# Patient Record
Sex: Female | Born: 1960 | Race: White | Hispanic: No | Marital: Married | Smoking: Current every day smoker
Health system: Southern US, Community
[De-identification: ages and names within clinical notes are randomized; demographics above are authoritative.]

## PROBLEM LIST (undated history)

## (undated) DIAGNOSIS — T8859XA Other complications of anesthesia, initial encounter: Secondary | ICD-10-CM

## (undated) DIAGNOSIS — R0600 Dyspnea, unspecified: Secondary | ICD-10-CM

## (undated) DIAGNOSIS — R112 Nausea with vomiting, unspecified: Secondary | ICD-10-CM

## (undated) DIAGNOSIS — J45909 Unspecified asthma, uncomplicated: Secondary | ICD-10-CM

## (undated) DIAGNOSIS — Z8489 Family history of other specified conditions: Secondary | ICD-10-CM

## (undated) DIAGNOSIS — D649 Anemia, unspecified: Secondary | ICD-10-CM

## (undated) DIAGNOSIS — R519 Headache, unspecified: Secondary | ICD-10-CM

## (undated) DIAGNOSIS — K219 Gastro-esophageal reflux disease without esophagitis: Secondary | ICD-10-CM

## (undated) DIAGNOSIS — J449 Chronic obstructive pulmonary disease, unspecified: Secondary | ICD-10-CM

## (undated) DIAGNOSIS — T4145XA Adverse effect of unspecified anesthetic, initial encounter: Secondary | ICD-10-CM

## (undated) DIAGNOSIS — R51 Headache: Secondary | ICD-10-CM

## (undated) DIAGNOSIS — Z9889 Other specified postprocedural states: Secondary | ICD-10-CM

## (undated) HISTORY — PX: CHOLECYSTECTOMY: SHX55

## (undated) HISTORY — DX: Chronic obstructive pulmonary disease, unspecified: J44.9

## (undated) HISTORY — DX: Unspecified asthma, uncomplicated: J45.909

## (undated) HISTORY — PX: ABDOMINAL HYSTERECTOMY: SHX81

## (undated) HISTORY — PX: FRACTURE SURGERY: SHX138

---

## 2016-09-13 HISTORY — PX: COLONOSCOPY: SHX174

## 2017-03-07 ENCOUNTER — Ambulatory Visit
Admission: RE | Admit: 2017-03-07 | Discharge: 2017-03-07 | Disposition: A | Discharge status: Home / Self Care | Payer: Self-pay | Source: Ambulatory Visit | Attending: Oncology | Admitting: Oncology

## 2017-03-07 ENCOUNTER — Ambulatory Visit: Payer: Self-pay | Attending: Oncology

## 2017-03-07 ENCOUNTER — Encounter (INDEPENDENT_AMBULATORY_CARE_PROVIDER_SITE_OTHER): Payer: Self-pay

## 2017-03-07 VITALS — Ht 62.0 in | Wt 182.0 lb

## 2017-03-07 DIAGNOSIS — Z0000 Encounter for general adult medical examination without abnormal findings: Secondary | ICD-10-CM

## 2017-03-07 NOTE — Progress Notes (Signed)
Subjective:     Patient ID: Raven Rogers, female   DOB: November 08, 1960, 56 y.o.   MRN: [id number]  HPI   Review of Systems     Objective:   Physical Exam  Pulmonary/Chest: Right breast exhibits no inverted nipple, no mass, no nipple discharge, no skin change and no tenderness. Left breast exhibits no inverted nipple, no mass, no nipple discharge, no skin change and no tenderness. Breasts are asymmetrical.  Right breast smaller than left       Assessment:     56 year old patient presents for [place] clinic visit. Patient screened, and meets [facility] eligibility.  Patient does not have insurance, Medicare or Medicaid.  Handout given on Affordable Care Act. Instructed patient on breast self-exam using teach back method. CBE unremarkable.  No mass or lump palpated. Last mammogram in 2016 at [facility].    Plan:     Sent for bilateral screening mammogram.

## 2017-03-08 ENCOUNTER — Other Ambulatory Visit: Payer: Self-pay | Admitting: *Deleted

## 2017-03-08 ENCOUNTER — Inpatient Hospital Stay
Admission: RE | Admit: 2017-03-08 | Discharge: 2017-03-08 | Disposition: A | Discharge status: Home / Self Care | Payer: Self-pay | Source: Ambulatory Visit | Attending: *Deleted | Admitting: *Deleted

## 2017-03-08 DIAGNOSIS — Z9289 Personal history of other medical treatment: Secondary | ICD-10-CM

## 2017-03-09 ENCOUNTER — Other Ambulatory Visit: Payer: Self-pay | Admitting: *Deleted

## 2017-03-09 DIAGNOSIS — N6489 Other specified disorders of breast: Secondary | ICD-10-CM

## 2017-03-17 ENCOUNTER — Ambulatory Visit
Admission: RE | Admit: 2017-03-17 | Discharge: 2017-03-17 | Disposition: A | Discharge status: Home / Self Care | Payer: Self-pay | Source: Ambulatory Visit | Attending: Oncology | Admitting: Oncology

## 2017-03-17 ENCOUNTER — Other Ambulatory Visit: Payer: Self-pay

## 2017-03-17 DIAGNOSIS — N6489 Other specified disorders of breast: Secondary | ICD-10-CM

## 2017-03-17 DIAGNOSIS — N63 Unspecified lump in unspecified breast: Secondary | ICD-10-CM

## 2017-03-23 ENCOUNTER — Ambulatory Visit
Admission: RE | Admit: 2017-03-23 | Discharge: 2017-03-23 | Disposition: A | Discharge status: Home / Self Care | Payer: Self-pay | Source: Ambulatory Visit | Attending: Oncology | Admitting: Oncology

## 2017-03-23 DIAGNOSIS — N63 Unspecified lump in unspecified breast: Secondary | ICD-10-CM

## 2017-03-23 HISTORY — PX: BREAST BIOPSY: SHX20

## 2017-03-24 LAB — SURGICAL PATHOLOGY

## 2017-03-31 ENCOUNTER — Encounter: Payer: Self-pay | Admitting: *Deleted

## 2017-03-31 NOTE — Progress Notes (Addendum)
Radiologist recommends surgical consult for left breast complex sclerosing lesion.  Scheduled consult with Dr. Jamal Collin on 04/12/17 at 10:15.  Notified patient ,and mailed appointment information. Explained to office, and to patient that [place] does not cover excision of benign lesion, and she may need to apply for [place] assistance.

## 2017-04-12 ENCOUNTER — Ambulatory Visit (INDEPENDENT_AMBULATORY_CARE_PROVIDER_SITE_OTHER): Payer: PRIVATE HEALTH INSURANCE | Admitting: General Surgery

## 2017-04-12 ENCOUNTER — Encounter: Payer: Self-pay | Admitting: General Surgery

## 2017-04-12 VITALS — BP 100/70 | HR 100 | Resp 14 | Ht 62.0 in | Wt 181.0 lb

## 2017-04-12 DIAGNOSIS — D242 Benign neoplasm of left breast: Secondary | ICD-10-CM | POA: Diagnosis not present

## 2017-04-12 NOTE — Patient Instructions (Addendum)
Follow-up diagnostic mammogram in 5 months

## 2017-04-12 NOTE — Progress Notes (Signed)
Patient ID: Raven Rogers, female   DOB: 1961/06/23, 56 y.o.   MRN: [id number]  Chief Complaint  Patient presents with  . Breast Problem    HPI Raven Rogers is a 56 y.o. female.  who presents for a breast evaluation referred by Koren Shiver RN with [facility]. The most recent mammogram and left breast biopsy was done on 03-23-17 showing complex sclerosing lesion. She could not feel anything different in the breast. Patient does perform regular self breast checks and gets regular mammograms done.   She is her with her husband.  HPI  Past Medical History:  Diagnosis Date  . Asthma   . COPD (chronic obstructive pulmonary disease) ([place])     Past Surgical History:  Procedure Laterality Date  . ABDOMINAL HYSTERECTOMY    . BREAST BIOPSY Left 03/23/2017   FEATURES CONSISTENT WITH A COMPLEX SCLEROSING LESION.   Marland Kitchen COLONOSCOPY  2018   [facility]  . FRACTURE SURGERY     leg    Family History  Problem Relation Age of Onset  . Colon cancer Mother 9  . Colon cancer Sister 39  . Breast cancer Neg Hx     Social History Social History  Substance Use Topics  . Smoking status: Current Every Day Smoker    Packs/day: 1.00    Years: 40.00    Types: Cigarettes  . Smokeless tobacco: Never Used  . Alcohol use No    No Known Allergies  Current Outpatient Prescriptions  Medication Sig Dispense Refill  . albuterol (PROVENTIL HFA;VENTOLIN HFA) 108 (90 Base) MCG/ACT inhaler Inhale into the lungs.    . budesonide-formoterol (SYMBICORT) 160-4.5 MCG/ACT inhaler Inhale 2 puffs into the lungs 2 (two) times daily.    . cetirizine (ZYRTEC) 10 MG tablet Take 10 mg by mouth.    Marland Kitchen ipratropium-albuterol (DUONEB) 0.5-2.5 (3) MG/3ML SOLN Take 3 mLs by nebulization.    . montelukast (SINGULAIR) 10 MG tablet Take 10 mg by mouth.    . ranitidine (ZANTAC) 150 MG tablet ranitidine 150 mg tablet  Take 1 tablet twice a day by oral route before meals.    . tiotropium (SPIRIVA) 18 MCG inhalation capsule Place into  inhaler and inhale.    . traZODone (DESYREL) 100 MG tablet Take 50 mg by mouth.     No current facility-administered medications for this visit.     Review of Systems Review of Systems  Constitutional: Negative.   Respiratory: Negative.   Cardiovascular: Negative.     Blood pressure 100/70, pulse 100, resp. rate 14, height 5\' 2"  (1.575 m), weight 181 lb (82.1 kg).  Physical Exam Physical Exam  Constitutional: She is oriented to person, place, and time. She appears well-developed and well-nourished.  Pulmonary/Chest: Right breast exhibits no inverted nipple, no mass, no nipple discharge, no skin change and no tenderness. Left breast exhibits no inverted nipple, no mass, no nipple discharge, no skin change and no tenderness. Breasts are symmetrical.  Lymphadenopathy:    She has no cervical adenopathy.    She has no axillary adenopathy.  Neurological: She is alert and oriented to person, place, and time.  Skin: Skin is warm and dry.  Psychiatric: She has a normal mood and affect.    Data Reviewed [place], mammogram,pathology Recent biopsy shows complex sclerosing lesion of upper inner quadrant of left breast, no atypia   Assessment   Complex sclerosing lesion - recommend careful monitoring vs excision at this time. No atypia was noted on pathology and likelihood of path upgrade is very  low.  Pt advised on the above.  She is agreeable with the plan     Plan    Follow-up diagnostic mammogram in 5 months    HPI, Physical Exam, Assessment and Plan have been scribed under the direction and in the presence of Mckinley Jewel, MD Karie Fetch, RN  I have completed the exam and reviewed the above documentation for accuracy and completeness.  I agree with the above.  Haematologist has been used and any errors in dictation or transcription are unintentional.  Raven Rogers G. Jamal Collin, M.D., F.A.C.S.   Junie Panning G 04/12/2017, 11:55 AM

## 2017-05-10 ENCOUNTER — Other Ambulatory Visit: Payer: Self-pay

## 2017-05-10 DIAGNOSIS — N63 Unspecified lump in unspecified breast: Secondary | ICD-10-CM

## 2017-05-10 NOTE — Progress Notes (Signed)
Per Dr. Angie Fava request, patient is scheduled for short-term follow-up mammogram, and ultrasound of left breast on 09/09/17 at 1:20 p.m.  Mailed appointment information to patient.

## 2017-08-31 ENCOUNTER — Encounter: Payer: Self-pay | Admitting: General Surgery

## 2017-08-31 ENCOUNTER — Ambulatory Visit (INDEPENDENT_AMBULATORY_CARE_PROVIDER_SITE_OTHER): Payer: Self-pay | Admitting: General Surgery

## 2017-08-31 VITALS — BP 130/74 | HR 101 | Resp 14 | Ht 62.5 in | Wt 184.0 lb

## 2017-08-31 DIAGNOSIS — D242 Benign neoplasm of left breast: Secondary | ICD-10-CM

## 2017-08-31 NOTE — Progress Notes (Signed)
Patient ID: Raven Rogers, female   DOB: April 09, 1961, 56 y.o.   MRN: [id number]  Chief Complaint  Patient presents with  . Follow-up    mammogram    HPI Raven Rogers is a 56 y.o. female who presents for a breast evaluation. Her mammogram is scheduled for 09/09/17. Patient does perform regular self breast checks and gets regular mammograms done.   She reports the left breast is still a little tender.    HPI  Past Medical History:  Diagnosis Date  . Asthma   . COPD (chronic obstructive pulmonary disease) ([place])     Past Surgical History:  Procedure Laterality Date  . ABDOMINAL HYSTERECTOMY    . BREAST BIOPSY Left 03/23/2017   FEATURES CONSISTENT WITH A COMPLEX SCLEROSING LESION.   Marland Kitchen COLONOSCOPY  2018   [facility]  . FRACTURE SURGERY     leg    Family History  Problem Relation Age of Onset  . Colon cancer Mother 8  . Colon cancer Sister 30  . Breast cancer Neg Hx     Social History Social History   Tobacco Use  . Smoking status: Current Every Day Smoker    Packs/day: 1.00    Years: 40.00    Pack years: 40.00    Types: Cigarettes  . Smokeless tobacco: Never Used  Substance Use Topics  . Alcohol use: No  . Drug use: No    No Known Allergies  Current Outpatient Medications  Medication Sig Dispense Refill  . albuterol (PROVENTIL HFA;VENTOLIN HFA) 108 (90 Base) MCG/ACT inhaler Inhale into the lungs.    . budesonide-formoterol (SYMBICORT) 160-4.5 MCG/ACT inhaler Inhale 2 puffs into the lungs 2 (two) times daily.    . cetirizine (ZYRTEC) 10 MG tablet Take 10 mg by mouth.    Marland Kitchen ipratropium-albuterol (DUONEB) 0.5-2.5 (3) MG/3ML SOLN Take 3 mLs by nebulization.    . montelukast (SINGULAIR) 10 MG tablet Take 10 mg by mouth.    . ranitidine (ZANTAC) 150 MG tablet ranitidine 150 mg tablet  Take 1 tablet twice a day by oral route before meals.    . tiotropium (SPIRIVA) 18 MCG inhalation capsule Place into inhaler and inhale.    . traZODone (DESYREL) 100 MG tablet Take  50 mg by mouth.     No current facility-administered medications for this visit.     Review of Systems Review of Systems  Constitutional: Negative.   Respiratory: Negative.   Cardiovascular: Negative.     Blood pressure 130/74, pulse (!) 101, resp. rate 14, height 5' 2.5" (1.588 m), weight 184 lb (83.5 kg).  Physical Exam Physical Exam  Constitutional: She is oriented to person, place, and time. She appears well-developed and well-nourished.  Eyes: Conjunctivae are normal. No scleral icterus.  Neck: Neck supple.  Cardiovascular: Normal rate, regular rhythm and normal heart sounds.  Pulmonary/Chest: Effort normal and breath sounds normal. Right breast exhibits no inverted nipple, no mass, no nipple discharge, no skin change and no tenderness. Left breast exhibits no inverted nipple, no mass, no nipple discharge, no skin change and no tenderness.  Abdominal: Soft. Normal appearance. There is no hepatosplenomegaly.  Lymphadenopathy:    She has no cervical adenopathy.    She has no axillary adenopathy.  Neurological: She is alert and oriented to person, place, and time.  Skin: Skin is warm and dry.  Psychiatric: She has a normal mood and affect.    Data Reviewed Previous notes and pathology  Assessment    Exam is stable.  Patient had  biopsy of a small lesion in the left breast which is a complex sclerosing lesion with no atypia.  Patient was given options of excision versus close follow-up.  She opted for the latter.  Since her mammogram is not scheduled until later this month I have asked that she follow-up with Koren Shiver from [place].    Plan    Mammogram on 09/09/17. Will follow up with Koren Shiver. To return to see Dr Bary Castilla if needed.     HPI, Physical Exam, Assessment and Plan have been scribed under the direction and in the presence of Mckinley Jewel, MD  Concepcion Living, LPN  I have completed the exam and reviewed the above documentation for accuracy and completeness.   I agree with the above.  Haematologist has been used and any errors in dictation or transcription are unintentional.  Garmon Dehn G. Jamal Collin, M.D., F.A.C.S.   Junie Panning G 09/01/2017, 8:37 AM

## 2017-08-31 NOTE — Patient Instructions (Signed)
Mammogram on 09/09/17. Will follow up with Koren Shiver. To return to see Dr Bary Castilla if needed.  Continue self breast exams. Call office for any new breast issues or concerns.

## 2017-09-09 ENCOUNTER — Ambulatory Visit
Admission: RE | Admit: 2017-09-09 | Discharge: 2017-09-09 | Disposition: A | Discharge status: Home / Self Care | Payer: Self-pay | Source: Ambulatory Visit | Attending: Oncology | Admitting: Oncology

## 2017-09-09 DIAGNOSIS — N63 Unspecified lump in unspecified breast: Secondary | ICD-10-CM

## 2017-09-12 ENCOUNTER — Encounter: Payer: Self-pay | Admitting: *Deleted

## 2017-09-12 NOTE — Progress Notes (Signed)
Patient has a birads 4 mammogram with recommendation for excision.  Talked with patient this morning and I have scheduled her to see Dr. Bary Castilla on 09/20/17 @ 10:45.  She is to arrive at 10:15, take a photo ID, all her meds, and remind them she is in [place].  Will follow-up per [facility] protocol.

## 2017-09-20 ENCOUNTER — Ambulatory Visit (INDEPENDENT_AMBULATORY_CARE_PROVIDER_SITE_OTHER): Payer: Self-pay | Admitting: General Surgery

## 2017-09-20 ENCOUNTER — Encounter: Payer: Self-pay | Admitting: General Surgery

## 2017-09-20 VITALS — BP 132/70 | HR 101 | Resp 14 | Ht 64.0 in | Wt 183.0 lb

## 2017-09-20 DIAGNOSIS — N6489 Other specified disorders of breast: Secondary | ICD-10-CM | POA: Insufficient documentation

## 2017-09-20 NOTE — Progress Notes (Addendum)
Patient ID: Raven Rogers, female   DOB: 05/20/1961, 57 y.o.   MRN: [id number]  Chief Complaint  Patient presents with  . Other    HPI Raven Rogers is a 57 y.o. female who presents for a breast evaluation. The most recent mammogram was done on 09/09/2017 and ultrasound. .  Patient does perform regular self breast checks and gets regular mammograms done. Tender under her left breast. Patient states six months ago, she had a biopsy done 03/23/2017.  HPI  Past Medical History:  Diagnosis Date  . Asthma   . COPD (chronic obstructive pulmonary disease) ([place])     Past Surgical History:  Procedure Laterality Date  . ABDOMINAL HYSTERECTOMY    . BREAST BIOPSY Left 03/23/2017   FEATURES CONSISTENT WITH A COMPLEX SCLEROSING LESION.   Marland Kitchen COLONOSCOPY  2018   [facility]  . FRACTURE SURGERY     leg    Family History  Problem Relation Age of Onset  . Colon cancer Mother 65  . Colon cancer Sister 35  . Breast cancer Neg Hx     Social History Social History   Tobacco Use  . Smoking status: Current Every Day Smoker    Packs/day: 1.00    Years: 40.00    Pack years: 40.00    Types: Cigarettes  . Smokeless tobacco: Never Used  Substance Use Topics  . Alcohol use: No  . Drug use: No    No Known Allergies  Current Outpatient Medications  Medication Sig Dispense Refill  . albuterol (PROVENTIL HFA;VENTOLIN HFA) 108 (90 Base) MCG/ACT inhaler Inhale into the lungs.    . budesonide-formoterol (SYMBICORT) 160-4.5 MCG/ACT inhaler Inhale 2 puffs into the lungs 2 (two) times daily.    . cetirizine (ZYRTEC) 10 MG tablet Take 10 mg by mouth.    Marland Kitchen ipratropium-albuterol (DUONEB) 0.5-2.5 (3) MG/3ML SOLN Take 3 mLs by nebulization.    . montelukast (SINGULAIR) 10 MG tablet Take 10 mg by mouth.    . tiotropium (SPIRIVA) 18 MCG inhalation capsule Place into inhaler and inhale.    . traZODone (DESYREL) 100 MG tablet Take 50 mg by mouth.     No current facility-administered medications for this  visit.     Review of Systems Review of Systems  Constitutional: Negative.   Respiratory: Positive for shortness of breath (COPD).   Cardiovascular: Negative.     Blood pressure 132/70, pulse (!) 101, resp. rate 14, height 5\' 4"  (1.626 m), weight 183 lb (83 kg).  Physical Exam Physical Exam  Constitutional: She is oriented to person, place, and time. She appears well-developed and well-nourished.  Eyes: Conjunctivae are normal. No scleral icterus.  Neck: Neck supple.  Cardiovascular: Normal rate, regular rhythm and normal heart sounds.  Pulmonary/Chest: Effort normal and breath sounds normal. Right breast exhibits no inverted nipple, no mass, no nipple discharge, no skin change and no tenderness. Left breast exhibits tenderness (left outer quadrant.). Left breast exhibits no inverted nipple, no mass, no nipple discharge and no skin change.  Lymphadenopathy:    She has no cervical adenopathy.    She has no axillary adenopathy.  Neurological: She is alert and oriented to person, place, and time.  Skin: Skin is warm and dry.    Data Reviewed March 23, 2017 biopsy: DIAGNOSIS:  A. LEFT BREAST, UPPER INNER QUADRANT; STEREOTACTIC BIOPSY:  - FEATURES CONSISTENT WITH A COMPLEX SCLEROSING LESION.   Note: Atypia is not seen in this material. The changes span 2.1 cm.   Review of the March 17, 2017 mammograms through September 09, 2017 mammograms including a ultrasound from the latter date were reviewed.  Maximum diameter on mammography was 1.2 cm, maximum diameter on ultrasound was 4 mm.  It is unusual to have the pathology show a larger lesion for radial scars.   The mammogram shows a residual distortion in the area of the top at marker.  Ultrasound shows a small residual biopsy cavity.  BI-RADS-4 based on original pathology.   Assessment      Benign breast exam.  Previous biopsy showing radial scar.    Plan    I spoke with the pathologist to confirm if the 2.1 cm size was correct.   If so I would recommend formal excision.  If this is a decibel air and it is really a point to 1 cm lesion, and in light of her recent imaging showing minimal residual changes from the biopsy, I would support either observation or excision based on the patient's desire.  This information was relayed to the patient and will make a final decision based on repeat pathologic review.    Follow up appointment to be announced. Patient to have a excision or have a mammogram in six months.   HPI, Physical Exam, Assessment and Plan have been scribed under the direction and in the presence of Hervey Ard, MD.  Gaspar Cola, CMA  I have completed the exam and reviewed the above documentation for accuracy and completeness.  I agree with the above.  Haematologist has been used and any errors in dictation or transcription are unintentional.  Hervey Ard, M.D., F.A.C.S. Robert Bellow 09/20/2017, 11:13 AM  Messaging received from the pathologist who was asked to review the original sample showed that area of complex sclerosing lesion/radial scar is likely 8 mm in diameter.  This is certainly acceptable for ongoing follow-up as no atypia has been appreciated.  This information will be forwarded to the patient for her decision making process.  Options remain observation versus excision.

## 2017-09-20 NOTE — Telephone Encounter (Signed)
Patient called back and wanted to proceed with surgery

## 2017-09-20 NOTE — Patient Instructions (Signed)
Follow up appointment to be announced. Patient to have a excision or have a mammogram in six months.

## 2017-09-21 ENCOUNTER — Telehealth: Payer: Self-pay | Admitting: *Deleted

## 2017-09-21 NOTE — Telephone Encounter (Signed)
She would like to have the area excised. This will be arraged.

## 2017-09-21 NOTE — Telephone Encounter (Signed)
Patient's surgery has been scheduled for 09-30-17 at [facility].   Surgery instructions have been reviewed with the patient by phone today.

## 2017-09-21 NOTE — Telephone Encounter (Signed)
-----   Message from Robert Bellow, MD sent at 09/20/2017 11:41 AM EST ----- Please notify the patient that the pathologist reported that the lesion is only one third of an inch, much smaller than the 7/8 of an inch on the original report.  This is perfectly acceptable for observation.  Let us know how she would like to proceed.

## 2017-09-22 ENCOUNTER — Other Ambulatory Visit: Payer: Self-pay | Admitting: General Surgery

## 2017-09-22 NOTE — Progress Notes (Signed)
The patient desires to proceed with excision of the radial scar.  This lesion was reviewed by the pathologist and the final size at 8 mm.  During her visit of September 20, 2017 we reviewed the risks associated with biopsy and the low risk associated with upstaging of radial scar to any pathologic process.  She desires to proceed to formal excision.

## 2017-09-23 ENCOUNTER — Encounter
Admission: RE | Admit: 2017-09-23 | Discharge: 2017-09-23 | Disposition: A | Discharge status: Home / Self Care | Payer: Self-pay | Source: Ambulatory Visit | Attending: General Surgery | Admitting: General Surgery

## 2017-09-23 ENCOUNTER — Other Ambulatory Visit: Payer: Self-pay

## 2017-09-23 HISTORY — DX: Headache, unspecified: R51.9

## 2017-09-23 HISTORY — DX: Nausea with vomiting, unspecified: R11.2

## 2017-09-23 HISTORY — DX: Other specified postprocedural states: R11.2

## 2017-09-23 HISTORY — DX: Other specified postprocedural states: Z98.890

## 2017-09-23 HISTORY — DX: Other complications of anesthesia, initial encounter: T88.59XA

## 2017-09-23 HISTORY — DX: Headache: R51

## 2017-09-23 HISTORY — DX: Gastro-esophageal reflux disease without esophagitis: K21.9

## 2017-09-23 HISTORY — DX: Dyspnea, unspecified: R06.00

## 2017-09-23 HISTORY — DX: Family history of other specified conditions: Z84.89

## 2017-09-23 HISTORY — DX: Adverse effect of unspecified anesthetic, initial encounter: T41.45XA

## 2017-09-23 HISTORY — DX: Anemia, unspecified: D64.9

## 2017-09-23 NOTE — Patient Instructions (Addendum)
  Your procedure is scheduled on: 09-30-17 FRIDAY Report to Same Day Surgery 2nd floor medical mall [facility] Entrance-take elevator on left to 2nd floor.  Check in with surgery information desk.) To find out your arrival time please call [phone] between 1PM - 3PM on 09-29-17 THURSDAY  Remember: Instructions that are not followed completely may result in serious medical risk, up to and including death, or upon the discretion of your surgeon and anesthesiologist your surgery may need to be rescheduled.    _x___ 1. Do not eat food after midnight the night before your procedure. NO GUM OR CANDY AFTER MIDNIGHT.  You may drink clear liquids up to 2 hours before you are scheduled to arrive at the hospital for your procedure.  Do not drink clear liquids within 2 hours of your scheduled arrival to the hospital.  Clear liquids include  --Water or Apple juice without pulp  --Clear carbohydrate beverage such as ClearFast or Gatorade  --Black Coffee or Clear Tea (No milk, no creamers, do not add anything to the coffee or Tea      __x__ 2. No Alcohol for 24 hours before or after surgery.   __x__3. No Smoking for 24 prior to surgery.   ____  4. Bring all medications with you on the day of surgery if instructed.    __x__ 5. Notify your doctor if there is any change in your medical condition     (cold, fever, infections).     Do not wear jewelry, make-up, hairpins, clips or nail polish.  Do not wear lotions, powders, or perfumes. You may wear deodorant.  Do not shave 48 hours prior to surgery. Men may shave face and neck.  Do not bring valuables to the hospital.    [facility] is not responsible for any belongings or valuables.               Contacts, dentures or bridgework may not be worn into surgery.  Leave your suitcase in the car. After surgery it may be brought to your room.  For patients admitted to the hospital, discharge time is determined by your treatment team.   Patients  discharged the day of surgery will not be allowed to drive home.  You will need someone to drive you home and stay with you the night of your procedure.    Please read over the following fact sheets that you were given:   [facility] Preparing for Surgery and or MRSA Information   ____ Take anti-hypertensive listed below, cardiac, seizure, asthma, anti-reflux and psychiatric medicines. These include:  1. NONE  2.  3.  4.  5.  6.  ____Fleets enema or Magnesium Citrate as directed.   _x___ Use CHG Soap or sage wipes as directed on instruction sheet   _X___ Use inhalers on the day of surgery and bring to hospital day of surgery-USE NEBULIZER AND SYMBICORT AM OF SURGERY AND BRING ALBUTEROL INHALER TO HOSPITAL  ____ Stop Metformin and Janumet 2 days prior to surgery.    ____ Take 1/2 of usual insulin dose the night before surgery and none on the morning surgery.   ____ Follow recommendations from Cardiologist, Pulmonologist or PCP regarding stopping Aspirin, Coumadin, Plavix ,Eliquis, Effient, or Pradaxa, and Pletal.  ____Stop Anti-inflammatories such as Advil, Aleve, Ibuprofen, Motrin, Naproxen, Naprosyn, Goodies powders or aspirin products. OK to take Tylenol    ____ Stop supplements until after surgery.   ____ Bring C-Pap to the hospital.

## 2017-09-27 ENCOUNTER — Inpatient Hospital Stay: Payer: Self-pay

## 2017-09-27 ENCOUNTER — Ambulatory Visit (INDEPENDENT_AMBULATORY_CARE_PROVIDER_SITE_OTHER): Payer: Self-pay | Admitting: General Surgery

## 2017-09-27 ENCOUNTER — Encounter
Admission: RE | Admit: 2017-09-27 | Discharge: 2017-09-27 | Disposition: A | Discharge status: Home / Self Care | Payer: Self-pay | Source: Ambulatory Visit | Attending: General Surgery | Admitting: General Surgery

## 2017-09-27 ENCOUNTER — Encounter: Payer: Self-pay | Admitting: General Surgery

## 2017-09-27 VITALS — BP 142/82 | HR 121 | Resp 12 | Ht 62.0 in | Wt 183.0 lb

## 2017-09-27 DIAGNOSIS — D242 Benign neoplasm of left breast: Secondary | ICD-10-CM

## 2017-09-27 DIAGNOSIS — N6489 Other specified disorders of breast: Secondary | ICD-10-CM

## 2017-09-27 LAB — BASIC METABOLIC PANEL
Anion gap: 10 (ref 5–15)
BUN: 18 mg/dL (ref 6–20)
CHLORIDE: 105 mmol/L (ref 101–111)
CO2: 25 mmol/L (ref 22–32)
CREATININE: 0.83 mg/dL (ref 0.44–1.00)
Calcium: 9.8 mg/dL (ref 8.9–10.3)
GFR calc non Af Amer: >60 mL/min (ref >60)
Glucose, Bld: 94 mg/dL (ref 65–99)
Potassium: 4.1 mmol/L (ref 3.5–5.1)
SODIUM: 140 mmol/L (ref 135–145)

## 2017-09-27 LAB — SURGICAL PCR SCREEN
MRSA, PCR: NEGATIVE
Staphylococcus aureus: NEGATIVE

## 2017-09-27 LAB — CBC
HCT: 41.8 % (ref 35.0–47.0)
HEMOGLOBIN: 14.1 g/dL (ref 12.0–16.0)
MCH: 27.9 pg (ref 26.0–34.0)
MCHC: 33.8 g/dL (ref 32.0–36.0)
MCV: 82.7 fL (ref 80.0–100.0)
PLATELETS: 265 10*3/uL (ref 150–440)
RBC: 5.06 MIL/uL (ref 3.80–5.20)
RDW: 15 % — ABNORMAL HIGH (ref 11.5–14.5)
WBC: 9.7 10*3/uL (ref 3.6–11.0)

## 2017-09-27 NOTE — Patient Instructions (Addendum)
Patient is scheduled for left breast biopsy on 09/30/2017. Patient may use the patch for not smoking. The patient is aware to call back for any questions or concerns.  Patient is scheduled for a wire needle location at [facility] on 09/30/17 just prior to surgery. She is aware to arrive there by 8:30 am.

## 2017-09-27 NOTE — Progress Notes (Signed)
Patient ID: Raven Rogers, female   DOB: 07-Dec-1960, 57 y.o.   MRN: [id number]  Chief Complaint  Patient presents with  . Pre-op Exam    HPI Raven Rogers is a 57 y.o. female here today for her pre op left breast biopsy scheduled on 09/30/2017.  Since her last visit she is decided she wants to proceed with excision of the area of radial scar.Marland Kitchen HPI  Past Medical History:  Diagnosis Date  . Anemia    H/O  . Asthma   . Complication of anesthesia   . COPD (chronic obstructive pulmonary disease) ([place])   . Dyspnea    WITH EXERTION DUE COPD  . Family history of adverse reaction to anesthesia    MOM-REALLY HARD TO WAKE UP  . GERD (gastroesophageal reflux disease)    RARE  . Headache    MIGRAINES  . PONV (postoperative nausea and vomiting)    AFTER HYSTERECTOMY    Past Surgical History:  Procedure Laterality Date  . ABDOMINAL HYSTERECTOMY    . BREAST BIOPSY Left 03/23/2017   FEATURES CONSISTENT WITH A COMPLEX SCLEROSING LESION.   . CHOLECYSTECTOMY    . COLONOSCOPY  2018   [facility]  . FRACTURE SURGERY     leg    Family History  Problem Relation Age of Onset  . Colon cancer Mother 18  . Colon cancer Sister 80  . Breast cancer Neg Hx     Social History Social History   Tobacco Use  . Smoking status: Current Every Day Smoker    Packs/day: 1.00    Years: 40.00    Pack years: 40.00    Types: Cigarettes  . Smokeless tobacco: Never Used  . Tobacco comment: PT TRYING TO CUT BACK AND IS SMOKING 1/2 NOW  Substance Use Topics  . Alcohol use: Yes    Comment: OCC  . Drug use: No    No Known Allergies  Current Outpatient Medications  Medication Sig Dispense Refill  . albuterol (PROVENTIL HFA;VENTOLIN HFA) 108 (90 Base) MCG/ACT inhaler Inhale 2 puffs into the lungs every 4 (four) hours as needed for wheezing or shortness of breath.     . budesonide-formoterol (SYMBICORT) 160-4.5 MCG/ACT inhaler Inhale 2 puffs into the lungs 2 (two) times daily.    . calcium carbonate  (TUMS - DOSED IN MG ELEMENTAL CALCIUM) 500 MG chewable tablet Chew 1 tablet by mouth as needed for indigestion or heartburn.    . cetirizine (ZYRTEC) 10 MG tablet Take 10 mg by mouth at bedtime.     . Cholecalciferol (VITAMIN D3 PO) Take 1 capsule by mouth daily.    Marland Kitchen ipratropium-albuterol (DUONEB) 0.5-2.5 (3) MG/3ML SOLN Take 3 mLs by nebulization 3 (three) times daily.     . montelukast (SINGULAIR) 10 MG tablet Take 10 mg by mouth at bedtime.     . naproxen sodium (ALEVE) 220 MG tablet Take 440 mg by mouth daily as needed (for pain or headache).    . tiotropium (SPIRIVA) 18 MCG inhalation capsule Place 18 mcg into inhaler and inhale daily after lunch. BETWEEN 3 AND 4 PM    . traZODone (DESYREL) 100 MG tablet Take 100 mg by mouth at bedtime.      No current facility-administered medications for this visit.     Review of Systems Review of Systems  Constitutional: Negative.   Respiratory: Negative.   Cardiovascular: Negative.     Blood pressure (!) 142/82, pulse (!) 121, resp. rate 12, height 5\' 2"  (1.575 m), weight 183  lb (83 kg).  Physical Exam Physical Exam  Constitutional: She is oriented to person, place, and time. She appears well-developed and well-nourished.  Cardiovascular: Normal rate, regular rhythm and normal heart sounds.  Pulmonary/Chest: Effort normal and breath sounds normal.  Neurological: She is alert and oriented to person, place, and time.  Skin: Skin is warm and dry.    Data Reviewed Ultrasound examination of the left breast was completed.  The previous biopsy site is not clearly delineated.  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    Plan    Needle localization will be required prior to the procedure.    Patient is scheduled for left breast biopsy on 09/30/2017. Patient may use the nicotine patch prior to the procedure. The patient is aware to call back for any questions or concerns. HPI, Physical Exam, Assessment and  Plan have been scribed under the direction and in the presence of Hervey Ard, MD.  Gaspar Cola, CMA  I have completed the exam and reviewed the above documentation for accuracy and completeness.  I agree with the above.  Haematologist has been used and any errors in dictation or transcription are unintentional.  Hervey Ard, M.D., F.A.C.S.  Patient is scheduled for a wire needle location at [facility] on 09/30/17 just prior to surgery. She is aware to arrive there by 8:30 am. Documented by Caryl-Lyn Otis Brace LPN  Forest Gleason Chelle Cayton 09/28/2017, 8:20 PM

## 2017-09-28 ENCOUNTER — Other Ambulatory Visit: Payer: Self-pay | Admitting: General Surgery

## 2017-09-28 DIAGNOSIS — D242 Benign neoplasm of left breast: Secondary | ICD-10-CM

## 2017-09-28 DIAGNOSIS — N6489 Other specified disorders of breast: Secondary | ICD-10-CM

## 2017-09-29 ENCOUNTER — Encounter: Payer: Self-pay | Admitting: *Deleted

## 2017-09-30 ENCOUNTER — Encounter: Payer: Self-pay | Admitting: *Deleted

## 2017-09-30 ENCOUNTER — Ambulatory Visit
Admission: RE | Admit: 2017-09-30 | Discharge: 2017-09-30 | Disposition: A | Discharge status: Home / Self Care | Payer: Self-pay | Source: Ambulatory Visit | Attending: General Surgery | Admitting: General Surgery

## 2017-09-30 ENCOUNTER — Encounter
Admission: RE | Disposition: A | Discharge status: Home / Self Care | Payer: Self-pay | Source: Ambulatory Visit | Attending: General Surgery

## 2017-09-30 ENCOUNTER — Ambulatory Visit: Payer: Self-pay | Admitting: Anesthesiology

## 2017-09-30 ENCOUNTER — Other Ambulatory Visit: Payer: Self-pay

## 2017-09-30 DIAGNOSIS — D242 Benign neoplasm of left breast: Secondary | ICD-10-CM

## 2017-09-30 DIAGNOSIS — N6489 Other specified disorders of breast: Secondary | ICD-10-CM

## 2017-09-30 DIAGNOSIS — N6022 Fibroadenosis of left breast: Secondary | ICD-10-CM | POA: Insufficient documentation

## 2017-09-30 DIAGNOSIS — F17200 Nicotine dependence, unspecified, uncomplicated: Secondary | ICD-10-CM | POA: Insufficient documentation

## 2017-09-30 DIAGNOSIS — L905 Scar conditions and fibrosis of skin: Secondary | ICD-10-CM | POA: Insufficient documentation

## 2017-09-30 DIAGNOSIS — K219 Gastro-esophageal reflux disease without esophagitis: Secondary | ICD-10-CM | POA: Insufficient documentation

## 2017-09-30 DIAGNOSIS — D493 Neoplasm of unspecified behavior of breast: Secondary | ICD-10-CM

## 2017-09-30 DIAGNOSIS — J449 Chronic obstructive pulmonary disease, unspecified: Secondary | ICD-10-CM | POA: Insufficient documentation

## 2017-09-30 DIAGNOSIS — Z79899 Other long term (current) drug therapy: Secondary | ICD-10-CM | POA: Insufficient documentation

## 2017-09-30 HISTORY — PX: BREAST EXCISIONAL BIOPSY: SUR124

## 2017-09-30 HISTORY — PX: BREAST BIOPSY: SHX20

## 2017-09-30 SURGERY — BREAST BIOPSY WITH NEEDLE LOCALIZATION
Anesthesia: General | Laterality: Left | Wound class: Clean

## 2017-09-30 MED ORDER — ACETAMINOPHEN 10 MG/ML IV SOLN
INTRAVENOUS | Status: AC
  Filled 2017-09-30: qty 100

## 2017-09-30 MED ORDER — SUCCINYLCHOLINE CHLORIDE 20 MG/ML IJ SOLN
INTRAMUSCULAR | Status: AC
  Filled 2017-09-30: qty 1

## 2017-09-30 MED ORDER — PROPOFOL 10 MG/ML IV BOLUS
INTRAVENOUS | Status: AC
  Filled 2017-09-30: qty 40

## 2017-09-30 MED ORDER — MIDAZOLAM HCL 2 MG/2ML IJ SOLN
INTRAMUSCULAR | Status: DC | PRN
  Administered 2017-09-30: 2 mg via INTRAVENOUS

## 2017-09-30 MED ORDER — LACTATED RINGERS IV SOLN
INTRAVENOUS | Status: DC
  Administered 2017-09-30 (×2): via INTRAVENOUS

## 2017-09-30 MED ORDER — FENTANYL CITRATE (PF) 100 MCG/2ML IJ SOLN
INTRAMUSCULAR | Status: AC
  Filled 2017-09-30: qty 2

## 2017-09-30 MED ORDER — ONDANSETRON HCL 4 MG/2ML IJ SOLN: 4.0000 mg | Freq: Once | INTRAMUSCULAR | Status: DC | PRN

## 2017-09-30 MED ORDER — ACETAMINOPHEN 10 MG/ML IV SOLN
INTRAVENOUS | Status: DC | PRN
  Administered 2017-09-30: 1000 mg via INTRAVENOUS

## 2017-09-30 MED ORDER — MIDAZOLAM HCL 2 MG/2ML IJ SOLN
INTRAMUSCULAR | Status: AC
  Filled 2017-09-30: qty 2

## 2017-09-30 MED ORDER — PHENYLEPHRINE HCL 10 MG/ML IJ SOLN
INTRAMUSCULAR | Status: AC
  Filled 2017-09-30: qty 1

## 2017-09-30 MED ORDER — FAMOTIDINE 20 MG PO TABS
ORAL_TABLET | ORAL | Status: AC
  Administered 2017-09-30: 20 mg via ORAL
  Filled 2017-09-30: qty 1

## 2017-09-30 MED ORDER — FAMOTIDINE 20 MG PO TABS
20.0000 mg | ORAL_TABLET | Freq: Once | ORAL | Status: AC
  Administered 2017-09-30: 20 mg via ORAL

## 2017-09-30 MED ORDER — LIDOCAINE HCL (PF) 2 % IJ SOLN
INTRAMUSCULAR | Status: AC
  Filled 2017-09-30: qty 10

## 2017-09-30 MED ORDER — ONDANSETRON HCL 4 MG/2ML IJ SOLN
INTRAMUSCULAR | Status: DC | PRN
  Administered 2017-09-30: 4 mg via INTRAVENOUS

## 2017-09-30 MED ORDER — PROPOFOL 10 MG/ML IV BOLUS
INTRAVENOUS | Status: DC | PRN
  Administered 2017-09-30: 40 mg via INTRAVENOUS
  Administered 2017-09-30: 120 mg via INTRAVENOUS
  Administered 2017-09-30: 40 mg via INTRAVENOUS

## 2017-09-30 MED ORDER — FENTANYL CITRATE (PF) 100 MCG/2ML IJ SOLN
INTRAMUSCULAR | Status: AC
  Administered 2017-09-30: 25 ug via INTRAVENOUS
  Filled 2017-09-30: qty 2

## 2017-09-30 MED ORDER — HYDROCODONE-ACETAMINOPHEN 5-325 MG PO TABS: 1.0000 | ORAL_TABLET | ORAL | 0 refills | Status: AC | PRN

## 2017-09-30 MED ORDER — ATROPINE SULFATE 0.4 MG/ML IJ SOLN
INTRAMUSCULAR | Status: AC
  Filled 2017-09-30: qty 2

## 2017-09-30 MED ORDER — LIDOCAINE HCL (CARDIAC) 20 MG/ML IV SOLN
INTRAVENOUS | Status: DC | PRN
  Administered 2017-09-30: 80 mg via INTRAVENOUS

## 2017-09-30 MED ORDER — FENTANYL CITRATE (PF) 100 MCG/2ML IJ SOLN
INTRAMUSCULAR | Status: DC | PRN
  Administered 2017-09-30: 50 ug via INTRAVENOUS
  Administered 2017-09-30 (×2): 25 ug via INTRAVENOUS

## 2017-09-30 MED ORDER — ONDANSETRON HCL 4 MG/2ML IJ SOLN
INTRAMUSCULAR | Status: AC
  Filled 2017-09-30: qty 2

## 2017-09-30 MED ORDER — DEXAMETHASONE SODIUM PHOSPHATE 10 MG/ML IJ SOLN
INTRAMUSCULAR | Status: DC | PRN
  Administered 2017-09-30: 5 mg via INTRAVENOUS

## 2017-09-30 MED ORDER — BUPIVACAINE-EPINEPHRINE 0.5% -1:200000 IJ SOLN
INTRAMUSCULAR | Status: DC | PRN
  Administered 2017-09-30: 20 mL
  Administered 2017-09-30: 10 mL

## 2017-09-30 MED ORDER — DEXAMETHASONE SODIUM PHOSPHATE 10 MG/ML IJ SOLN
INTRAMUSCULAR | Status: AC
  Filled 2017-09-30: qty 1

## 2017-09-30 MED ORDER — EPHEDRINE SULFATE 50 MG/ML IJ SOLN
INTRAMUSCULAR | Status: AC
  Filled 2017-09-30: qty 1

## 2017-09-30 MED ORDER — GLYCOPYRROLATE 0.2 MG/ML IJ SOLN
INTRAMUSCULAR | Status: DC | PRN
  Administered 2017-09-30: 0.2 mg via INTRAVENOUS

## 2017-09-30 MED ORDER — PHENYLEPHRINE HCL 10 MG/ML IJ SOLN
INTRAMUSCULAR | Status: DC | PRN
  Administered 2017-09-30 (×4): 100 ug via INTRAVENOUS

## 2017-09-30 MED ORDER — KETOROLAC TROMETHAMINE 30 MG/ML IJ SOLN
INTRAMUSCULAR | Status: DC | PRN
  Administered 2017-09-30: 30 mg via INTRAVENOUS

## 2017-09-30 MED ORDER — FENTANYL CITRATE (PF) 100 MCG/2ML IJ SOLN
25.0000 ug | INTRAMUSCULAR | Status: AC | PRN
  Administered 2017-09-30 (×6): 25 ug via INTRAVENOUS

## 2017-09-30 SURGICAL SUPPLY — 39 items
BANDAGE ELASTIC 6 LF NS (GAUZE/BANDAGES/DRESSINGS) IMPLANT
BLADE SURG 15 STRL SS SAFETY (BLADE) ×3 IMPLANT
BNDG GAUZE 4.5X4.1 6PLY STRL (MISCELLANEOUS) ×3 IMPLANT
CANISTER SUCT 1200ML W/VALVE (MISCELLANEOUS) ×3 IMPLANT
CHLORAPREP W/TINT 26ML (MISCELLANEOUS) ×3 IMPLANT
CLOSURE WOUND 1/2 X4 (GAUZE/BANDAGES/DRESSINGS) ×2
CNTNR SPEC 2.5X3XGRAD LEK (MISCELLANEOUS) ×1
CONT SPEC 4OZ STER OR WHT (MISCELLANEOUS) ×2
CONTAINER SPEC 2.5X3XGRAD LEK (MISCELLANEOUS) ×1 IMPLANT
COVER PROBE FLX POLY STRL (MISCELLANEOUS) ×3 IMPLANT
DEVICE DUBIN SPECIMEN MAMMOGRA (MISCELLANEOUS) ×3 IMPLANT
DRAPE CHEST BREAST 77X106 FENE (MISCELLANEOUS) ×3 IMPLANT
DRAPE LAPAROTOMY 100X77 ABD (DRAPES) ×3 IMPLANT
DRSG TELFA 4X3 1S NADH ST (GAUZE/BANDAGES/DRESSINGS) ×3 IMPLANT
ELECT CAUTERY BLADE TIP 2.5 (TIP)
ELECT REM PT RETURN 9FT ADLT (ELECTROSURGICAL) ×3
ELECTRODE CAUTERY BLDE TIP 2.5 (TIP) IMPLANT
ELECTRODE REM PT RTRN 9FT ADLT (ELECTROSURGICAL) ×1 IMPLANT
GAUZE FLUFF 18X24 1PLY STRL (GAUZE/BANDAGES/DRESSINGS) IMPLANT
GLOVE BIO SURGEON STRL SZ7.5 (GLOVE) ×6 IMPLANT
GLOVE INDICATOR 8.0 STRL GRN (GLOVE) ×6 IMPLANT
GOWN STRL REUS W/ TWL LRG LVL3 (GOWN DISPOSABLE) ×2 IMPLANT
GOWN STRL REUS W/TWL LRG LVL3 (GOWN DISPOSABLE) ×4
KIT RM TURNOVER STRD PROC AR (KITS) ×3 IMPLANT
LABEL OR SOLS (LABEL) IMPLANT
MARGIN MAP 10MM (MISCELLANEOUS) ×3 IMPLANT
NEEDLE HYPO 22GX1.5 SAFETY (NEEDLE) ×3 IMPLANT
NEEDLE HYPO 25X1 1.5 SAFETY (NEEDLE) IMPLANT
PACK BASIN MINOR ARMC (MISCELLANEOUS) ×3 IMPLANT
STRIP CLOSURE SKIN 1/2X4 (GAUZE/BANDAGES/DRESSINGS) ×4 IMPLANT
SUT ETHILON 3-0 FS-10 30 BLK (SUTURE) ×3
SUT VIC AB 2-0 CT1 27 (SUTURE) ×2
SUT VIC AB 2-0 CT1 TAPERPNT 27 (SUTURE) ×1 IMPLANT
SUT VIC AB 4-0 FS2 27 (SUTURE) ×3 IMPLANT
SUTURE EHLN 3-0 FS-10 30 BLK (SUTURE) ×1 IMPLANT
SWABSTK COMLB BENZOIN TINCTURE (MISCELLANEOUS) ×3 IMPLANT
SYR CONTROL 10ML (SYRINGE) ×3 IMPLANT
TAPE TRANSPORE STRL 2 31045 (GAUZE/BANDAGES/DRESSINGS) IMPLANT
WATER STERILE IRR 1000ML POUR (IV SOLUTION) ×3 IMPLANT

## 2017-09-30 NOTE — OR Nursing (Signed)
Dr. Bary Castilla in to see pt 1236 pm

## 2017-09-30 NOTE — Anesthesia Postprocedure Evaluation (Signed)
Anesthesia Post Note  Patient: Raven Rogers  Procedure(s) Performed: BREAST BIOPSY WITH NEEDLE LOCALIZATION (Left )  Patient location during evaluation: PACU Anesthesia Type: General Level of consciousness: awake and alert Pain management: pain level controlled Vital Signs Assessment: post-procedure vital signs reviewed and stable Respiratory status: spontaneous breathing, nonlabored ventilation, respiratory function stable and patient connected to nasal cannula oxygen Cardiovascular status: blood pressure returned to baseline and stable Postop Assessment: no apparent nausea or vomiting Anesthetic complications: no     Last Vitals:  Vitals:   09/30/17 1232 09/30/17 1248  BP: 127/87 120/81  Pulse: 96 94  Resp: 14 16  Temp: 36.4 C   SpO2: 95% 99%    Last Pain:  Vitals:   09/30/17 1248  TempSrc:   PainSc: [place]

## 2017-09-30 NOTE — Op Note (Signed)
Preoperative diagnosis: Radial scar left breast.  Postoperative diagnosis: Same.  Operative procedure: Left breast biopsy with wire localization.  Operative procedure Surgeon Hervey Ard, MD.  Anesthesia: General by LMA, Marcaine 0.5% with 1-200,000 units of epinephrine, 30 cc.  Estimated blood loss: Less than 5 cc.  Clinical note: This 57 year old woman had a mammographic abnormality and biopsy showed evidence of radial scar.  She desired formal excision.  Operative note: The patient underwent wire localization prior to the procedure.  The medial to laterally placed wire showed the tip at the previous marker.  Patient underwent general anesthesia without difficulty.  Ultrasound was used to confirm the location of the tip of the wire.  Field block anesthesia with Marcaine with epinephrine was achieved.  A curvilinear incision from the 10 and 12 o'clock position was made carried out through skin subtendinous tissue with hemostasis achieved by electrocautery.  After going through the adipose layer a 2 x 2.5 x 3 cm block of tissue was excised orientated and sent for specimen radiograph.  The previous placed wire and clip entered the superior medial aspect of the block.  The deep tissue was approximated with interrupted 2-0 Vicryl figure-of-eight suture.  The adipose layer was closed in a similar fashion.  The skin closed with a running 4-0 Vicryl subarticular suture.  Benzoin, Steri-Strips, Telfa and fluff gauze dressing followed by surgical bra was applied.  Patient tolerated procedure well was taken to recovery in stable condition.

## 2017-09-30 NOTE — Transfer of Care (Signed)
Immediate Anesthesia Transfer of Care Note  Patient: Raven Rogers  Procedure(s) Performed: BREAST BIOPSY WITH NEEDLE LOCALIZATION (Left )  Patient Location: PACU  Anesthesia Type:General  Level of Consciousness: awake, alert  and responds to stimulation  Airway & Oxygen Therapy: Patient Spontanous Breathing and Patient connected to face mask oxygen  Post-op Assessment: Report given to RN and Post -op Vital signs reviewed and stable  Post vital signs: Reviewed and stable  Last Vitals:  Vitals:   09/30/17 1008 09/30/17 1142  BP: 122/79 120/75  Pulse: 98 (!) 104  Resp:    Temp: (!) 36.3 C   SpO2: 98% 100%    Last Pain:  Vitals:   09/30/17 0951  TempSrc: Tympanic         Complications: No apparent anesthesia complications

## 2017-09-30 NOTE — Discharge Instructions (Signed)

## 2017-09-30 NOTE — Anesthesia Preprocedure Evaluation (Signed)
Anesthesia Evaluation  Patient identified by MRN, date of birth, ID band Patient awake    Reviewed: Allergy & Precautions, NPO status , Patient's Chart, lab work & pertinent test results, reviewed documented beta blocker date and time   History of Anesthesia Complications (+) PONV and Family history of anesthesia reaction  Airway Mallampati: III  TM Distance: >3 FB     Dental  (+) Chipped   Pulmonary shortness of breath, asthma , COPD, Current Smoker,           Cardiovascular      Neuro/Psych    GI/Hepatic GERD  Controlled,  Endo/Other    Renal/GU      Musculoskeletal   Abdominal   Peds  Hematology  (+) anemia ,   Anesthesia Other Findings   Reproductive/Obstetrics                             Anesthesia Physical Anesthesia Plan  ASA: III  Anesthesia Plan: General   Post-op Pain Management:    Induction: Intravenous  PONV Risk Score and Plan:   Airway Management Planned: Oral ETT and LMA  Additional Equipment:   Intra-op Plan:   Post-operative Plan:   Informed Consent: I have reviewed the patients History and Physical, chart, labs and discussed the procedure including the risks, benefits and alternatives for the proposed anesthesia with the patient or authorized representative who has indicated his/her understanding and acceptance.     Plan Discussed with: CRNA  Anesthesia Plan Comments:         Anesthesia Quick Evaluation

## 2017-09-30 NOTE — Anesthesia Post-op Follow-up Note (Signed)
Anesthesia QCDR form completed.        

## 2017-09-30 NOTE — Anesthesia Procedure Notes (Signed)
Procedure Name: LMA Insertion Date/Time: 09/30/2017 10:51 AM Performed by: Lowry Bowl, CRNA Pre-anesthesia Checklist: Patient identified, Emergency Drugs available, Suction available and Patient being monitored Patient Re-evaluated:Patient Re-evaluated prior to induction Oxygen Delivery Method: Circle system utilized Preoxygenation: Pre-oxygenation with 100% oxygen Induction Type: IV induction Ventilation: Mask ventilation without difficulty LMA: LMA inserted LMA Size: 4.0 Number of attempts: 2 Placement Confirmation: positive ETCO2 and breath sounds checked- equal and bilateral Tube secured with: Tape Dental Injury: Teeth and Oropharynx as per pre-operative assessment

## 2017-09-30 NOTE — H&P (Signed)
For wire localization and excision left breast papilloma.

## 2017-10-03 ENCOUNTER — Encounter: Payer: Self-pay | Admitting: General Surgery

## 2017-10-05 ENCOUNTER — Encounter: Payer: Self-pay | Admitting: General Surgery

## 2017-10-06 ENCOUNTER — Telehealth: Payer: Self-pay | Admitting: General Surgery

## 2017-10-06 LAB — SURGICAL PATHOLOGY

## 2017-10-06 NOTE — Telephone Encounter (Signed)
Message left that pathology report was fine.  CSR, 1.2 cm. No atypia.

## 2017-10-10 ENCOUNTER — Encounter: Payer: Self-pay | Admitting: *Deleted

## 2017-10-10 NOTE — Progress Notes (Signed)
Patient has results of her benign biopsy.  She will need to return to [facility] in 6 months for bilateral screening.  HSIS to [place].

## 2017-10-11 ENCOUNTER — Encounter: Payer: Self-pay | Admitting: General Surgery

## 2017-10-11 ENCOUNTER — Telehealth: Payer: Self-pay | Admitting: *Deleted

## 2017-10-11 ENCOUNTER — Ambulatory Visit (INDEPENDENT_AMBULATORY_CARE_PROVIDER_SITE_OTHER): Payer: Self-pay | Admitting: General Surgery

## 2017-10-11 VITALS — BP 132/74 | HR 74 | Resp 14 | Ht 64.0 in | Wt 184.0 lb

## 2017-10-11 DIAGNOSIS — N6489 Other specified disorders of breast: Secondary | ICD-10-CM

## 2017-10-11 NOTE — Patient Instructions (Addendum)
Patient  to return in one month. The patient is aware to call back for any questions or concerns.The patient is aware to use a heating pad as needed for comfort. Patient to take 2 Advil two times daily for the couple days.   Patient may massage the area with lotion.

## 2017-10-11 NOTE — Telephone Encounter (Addendum)
Received inbasket message from Cherie Dark that patient had called to speak to me today.  I have returned her call, but no answer.  I left her a message to call me back..  Patient returned my call.  States she has received a bill for her surgery.  Unable to see the [facility] billing flag on the date of service.  Encouraged her bring or mail her bill and we will pay what [facility] allows.  She will send financial paperwork to see if she qualifies for financial assistance for the balance.

## 2017-10-11 NOTE — Progress Notes (Signed)
Patient ID: Raven Rogers, female   DOB: Jan 28, 1961, 57 y.o.   MRN: [id number]  Chief Complaint  Patient presents with  . Routine Post Op    HPI Raven Rogers is a 57 y.o. female here today for her post op left breast biopsy done 09/30/2017. Patient states she is having some pain off and on and still sore.  HPI  Past Medical History:  Diagnosis Date  . Anemia    H/O  . Asthma   . Complication of anesthesia   . COPD (chronic obstructive pulmonary disease) ([place])    pt does not see pulmonologist-she is followed by her pcp  . Dyspnea    WITH EXERTION DUE TO COPD  . Family history of adverse reaction to anesthesia    MOM-REALLY HARD TO WAKE UP  . GERD (gastroesophageal reflux disease)    RARE  . Headache    MIGRAINES  . PONV (postoperative nausea and vomiting)    AFTER HYSTERECTOMY    Past Surgical History:  Procedure Laterality Date  . ABDOMINAL HYSTERECTOMY    . BREAST BIOPSY Left 03/23/2017   FEATURES CONSISTENT WITH A COMPLEX SCLEROSING LESION.   Marland Kitchen BREAST BIOPSY Left 09/30/2017   Procedure: BREAST BIOPSY WITH NEEDLE LOCALIZATION;  Surgeon: Robert Bellow, MD;  Location: [facility] ORS;  Service: General;  Laterality: Left;  . BREAST EXCISIONAL BIOPSY Left 09/30/2017   lumpectomy for radial scar  . CHOLECYSTECTOMY    . COLONOSCOPY  2018   [facility]  . FRACTURE SURGERY     leg    Family History  Problem Relation Age of Onset  . Colon cancer Mother 36  . Colon cancer Sister 69  . Breast cancer Neg Hx     Social History Social History   Tobacco Use  . Smoking status: Current Every Day Smoker    Packs/day: 1.00    Years: 40.00    Pack years: 40.00    Types: Cigarettes  . Smokeless tobacco: Never Used  . Tobacco comment: PT TRYING TO CUT BACK AND IS SMOKING 1/2 NOW  Substance Use Topics  . Alcohol use: Yes    Comment: OCC  . Drug use: No    No Known Allergies  Current Outpatient Medications  Medication Sig Dispense Refill  . albuterol (PROVENTIL  HFA;VENTOLIN HFA) 108 (90 Base) MCG/ACT inhaler Inhale 2 puffs into the lungs every 4 (four) hours as needed for wheezing or shortness of breath.     . budesonide-formoterol (SYMBICORT) 160-4.5 MCG/ACT inhaler Inhale 2 puffs into the lungs 2 (two) times daily.    . calcium carbonate (TUMS - DOSED IN MG ELEMENTAL CALCIUM) 500 MG chewable tablet Chew 1 tablet by mouth as needed for indigestion or heartburn.    . cetirizine (ZYRTEC) 10 MG tablet Take 10 mg by mouth at bedtime.     . Cholecalciferol (VITAMIN D3 PO) Take 1 capsule by mouth daily.    . Eletriptan Hydrobromide (RELPAX PO) Take 10 mg by mouth as needed (MIGRAINES).    . Fexofenadine HCl (MUCINEX ALLERGY PO) Take by mouth.    Marland Kitchen HYDROcodone-acetaminophen (NORCO/VICODIN) 5-325 MG tablet Take 1 tablet by mouth every 4 (four) hours as needed for moderate pain. 12 tablet 0  . ipratropium-albuterol (DUONEB) 0.5-2.5 (3) MG/3ML SOLN Take 3 mLs by nebulization 3 (three) times daily.     . montelukast (SINGULAIR) 10 MG tablet Take 10 mg by mouth at bedtime.     . naproxen sodium (ALEVE) 220 MG tablet Take 440 mg by mouth  daily as needed (for pain or headache).    . tiotropium (SPIRIVA) 18 MCG inhalation capsule Place 18 mcg into inhaler and inhale daily after lunch. BETWEEN 3 AND 4 PM    . traZODone (DESYREL) 100 MG tablet Take 100 mg by mouth at bedtime.      No current facility-administered medications for this visit.     Review of Systems Review of Systems  Constitutional: Negative.   Respiratory: Negative.   Cardiovascular: Negative.     Blood pressure 132/74, pulse 74, resp. rate 14, height 5\' 4"  (1.626 m), weight 184 lb (83.5 kg).  Physical Exam Physical Exam  Constitutional: She is oriented to person, place, and time. She appears well-developed and well-nourished.  Cardiovascular: Normal rate, regular rhythm and normal heart sounds.  Pulmonary/Chest: Effort normal and breath sounds normal.    Neurological: She is alert and  oriented to person, place, and time.  Skin: Skin is warm and dry.    Data Reviewed DIAGNOSIS:  A. LEFT BREAST, 12:00; NEEDLE LOCALIZED EXCISION:  - COMPLEX SCLEROSING LESION/RADIAL SCAR, 1.2 CM.  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  - NEGATIVE FOR ATYPIA AND MALIGNANCY.   Assessment    Doing well post breast biopsy.     Plan         Patient  to return in one month. The patient is aware to call back for any questions or concerns.  The patient is aware to use a heating pad as needed for comfort.  Patient to take 2 Advil two times daily for the couple days.   Patient may massage the area with lotion.    HPI, Physical Exam, Assessment and Plan have been scribed under the direction and in the presence of Hervey Ard, MD.  Gaspar Cola, CMA  I have completed the exam and reviewed the above documentation for accuracy and completeness.  I agree with the above.  Haematologist has been used and any errors in dictation or transcription are unintentional.  Hervey Ard, M.D., F.A.C.S.  Raven Rogers 10/11/2017, 11:08 AM

## 2017-10-17 ENCOUNTER — Encounter: Payer: Self-pay | Admitting: *Deleted

## 2017-11-10 ENCOUNTER — Ambulatory Visit: Payer: Self-pay | Admitting: General Surgery

## 2018-07-03 ENCOUNTER — Ambulatory Visit: Payer: Self-pay | Attending: Oncology | Admitting: *Deleted

## 2018-07-03 ENCOUNTER — Encounter: Payer: Self-pay | Admitting: *Deleted

## 2018-07-03 VITALS — BP 110/80 | HR 123 | Temp 97.8°F | Ht 63.0 in | Wt 182.0 lb

## 2018-07-03 DIAGNOSIS — N63 Unspecified lump in unspecified breast: Secondary | ICD-10-CM

## 2018-07-03 NOTE — Progress Notes (Signed)
  Subjective:     Patient ID: Raven Rogers, female   DOB: [birth year], 59 y.o.   MRN: [id number]  HPI   Review of Systems     Objective:   Physical Exam  Pulmonary/Chest: Right breast exhibits mass and tenderness. Right breast exhibits no inverted nipple, no nipple discharge and no skin change. Left breast exhibits mass and tenderness. Left breast exhibits no inverted nipple, no nipple discharge and no skin change. No breast swelling, tenderness, discharge or bleeding. Breasts are symmetrical.  Tenderness noted at bilateral breast         Assessment:     57 year old White female returns to [facility] for annual screening.  Patient with a history of benign left breast biopsy in January of this year.  Family history of colon cancer in her mom at 44 and her sister at age 38.  On clinical breast exam I can palpate nodularity at 7:00 left breast and 5:00 right breast at the retroareolar area.   Although the nodularity is in similar areas, the nodule at the left breast is about 1 cm, and the nodule at the right breast is approximately 0.5 cm.  Taught self breast awareness.  Patient has a history of hysterectomy.  Vital signs today reveal heart rate of 123.  Rechecked at 113.  Patient is at rest.  Denies shortness of breath, palpitations or chest pain.  I can smell cigarette smoke on her.  State she just smoked before coming in today.  Also states she has COPD.  Discussed smoking cessation.  She is not interested at this time.  Encouraged patient to follow up with her primary care provider about her increased heart rate.  Encouraged her to go to the ED if she has shortness of breath, chest pain, or palpitations associated with her elevated heart rate.  Taught patient how to take her pulse.  She is also to recheck at [facility] or [facility].  Patient is agreeable to the plan.       Plan:     Bilateral diagnostic mammogram and ultrasound ordered.  Jeanella Anton to schedule patient's mammogram.  Will follow  up per [facility] protocol.

## 2018-07-03 NOTE — Patient Instructions (Signed)
Gave patient hand-out, Women Staying Healthy, Active and Well from [facility], with education on breast health, pap smears, heart and colon health. 

## 2018-07-10 ENCOUNTER — Encounter: Payer: Self-pay | Admitting: *Deleted

## 2018-07-10 ENCOUNTER — Ambulatory Visit
Admission: RE | Admit: 2018-07-10 | Discharge: 2018-07-10 | Disposition: A | Discharge status: Home / Self Care | Payer: Self-pay | Source: Ambulatory Visit | Attending: Oncology | Admitting: Oncology

## 2018-07-10 DIAGNOSIS — N63 Unspecified lump in unspecified breast: Secondary | ICD-10-CM

## 2018-07-10 NOTE — Progress Notes (Signed)
Stable findings on mammogram.  Letter mailed from the [place] to inform patient of her normal mammogram results.  Patient is to follow-up with annual screening in one year.  HSIS to [place].

## 2019-02-05 IMAGING — US US BREAST*L* LIMITED INC AXILLA
1 series · 5 of 5 positions shown · non-contrast
Comparison: Previous exam(s).

CLINICAL DATA: 57-year-old female status post excisional biopsy of
a complex sclerosing reason in September 2017. Patient presents today
with bilateral tenderness, sensitivity and lumpiness in the
subareolar breast.

EXAM:
DIGITAL DIAGNOSTIC BILATERAL MAMMOGRAM WITH CAD AND TOMO
ULTRASOUND BILATERAL BREAST

[Series 1: us breast*left* limited inc axilla · 0.06mm/px · 5 of 5 slices shown]
[im 1/5]
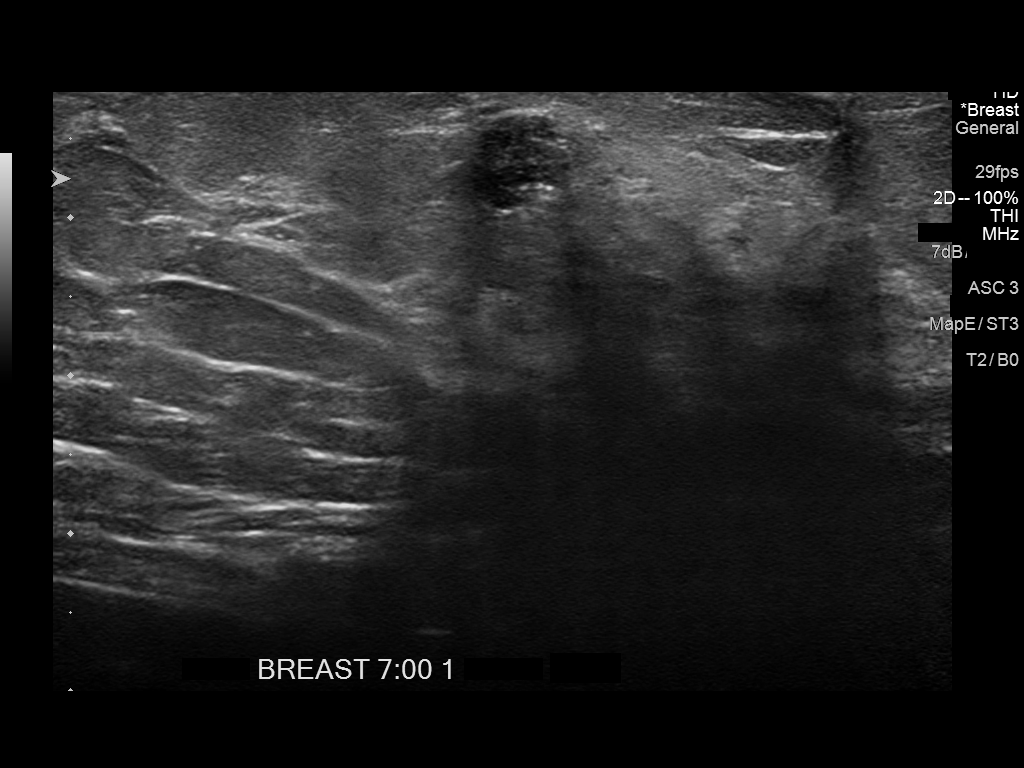
[im 2/5]
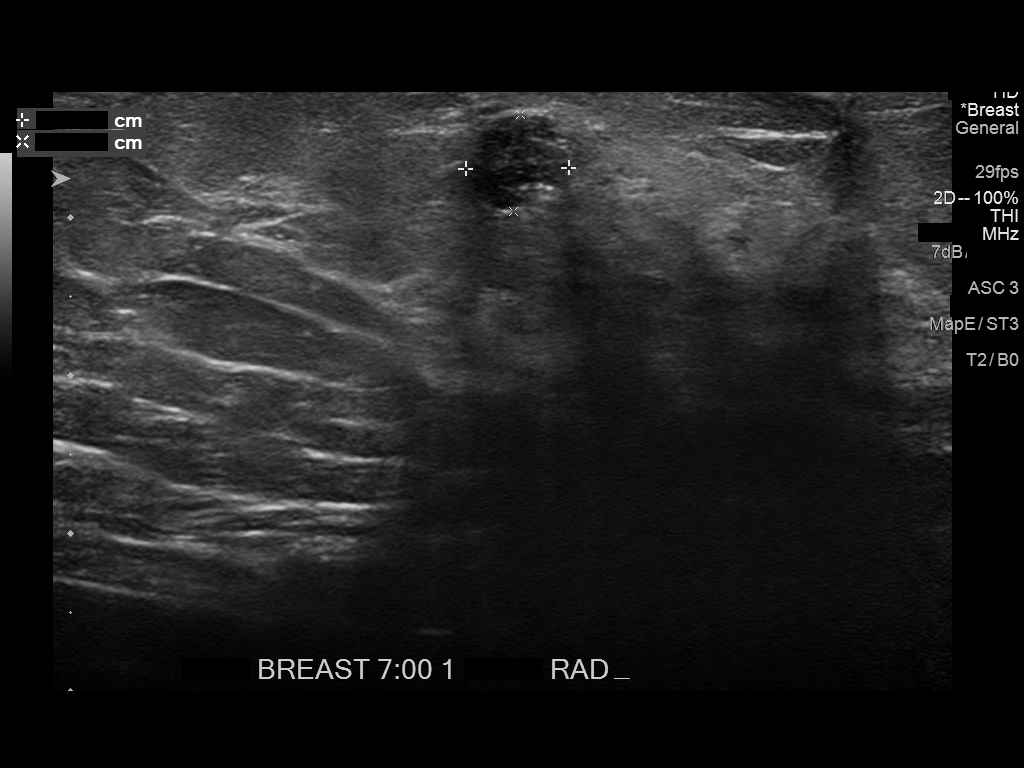
[im 3/5]
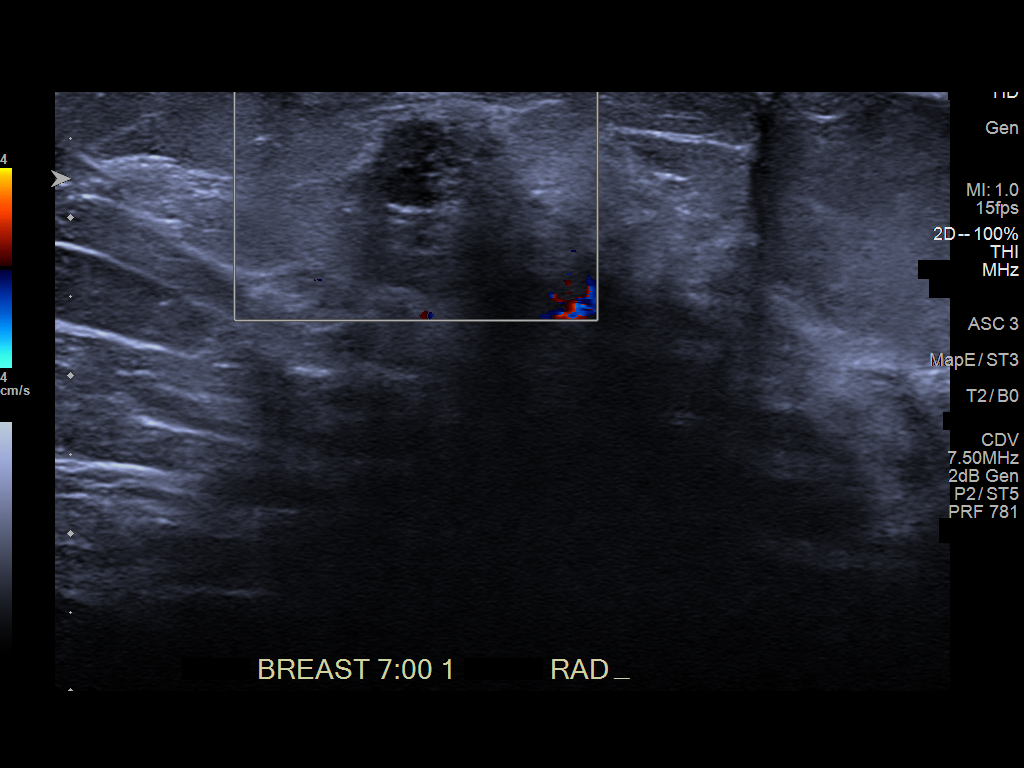
[im 4/5]
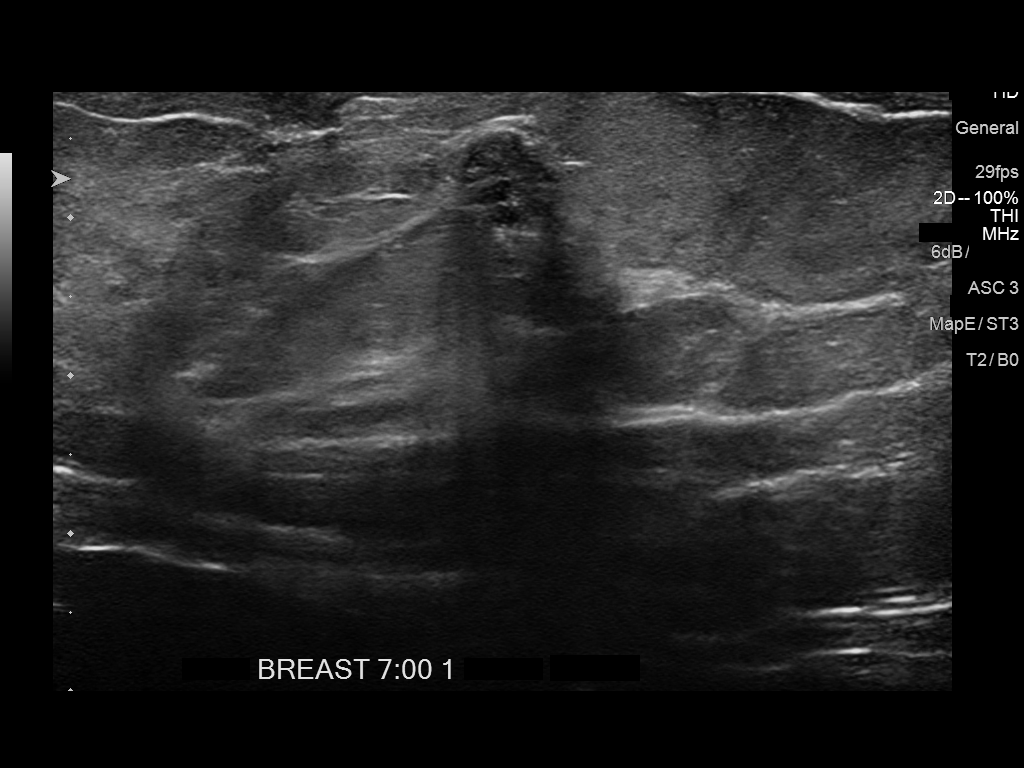
[im 5/5]
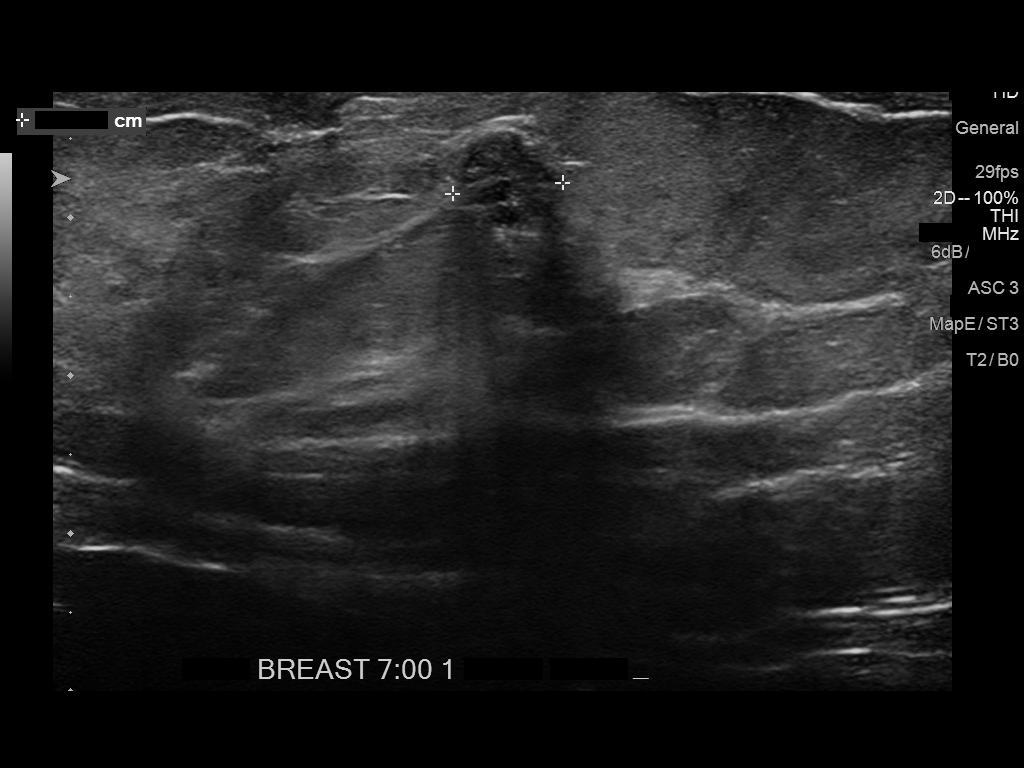

[5 of 5 positions shown; findings below may reference images not displayed]

ACR Breast Density Category c: The breast tissue is heterogeneously
dense, which may obscure small masses.
FINDINGS: Radiopaque BBs were placed at the site of the patient's clinical
symptoms in the periareolar breast bilaterally. An oval,
circumscribed equal density mass is seen deep to the left-sided BB.
This is mammographically stable dating back to at least 1893.
Additional scattered circumscribed equal density masses are noted
bilaterally. No suspicious findings are identified mammographically.

Mammographic images were processed with CAD.

On physical exam, I palpated general lumpiness of the bilateral
breast. No focal suspicious masses are palpated.

Targeted ultrasound is performed, showing a circumscribed,
multi-cystic mass with posterior acoustic enhancement at the 7
position 1 cm from nipple. It measures 7 x 7 x 6 mm. There is no
associated vascularity. This corresponds with the mammographically
stable mass and is consistent with a cluster of cysts. No additional
suspicious findings are identified in either breast.
IMPRESSION: 1. No suspicious mammographic or sonographic findings corresponding
with the patient's bilateral symptoms. Recommendation is for
clinical follow-up.
2. Benign left breast cyst cluster demonstrating mammographic
stability dating back to 1893. No further imaging follow-up
required.

RECOMMENDATION:
1. Clinical follow-up recommended for the symptomatic area of
concern in the bilateral breasts. Any further workup should be based
on clinical grounds.
2.  Screening mammogram in one year.(Code:HW-7-VFQ)

I have discussed the findings and recommendations with the patient.
Results were also provided in writing at the conclusion of the
visit. If applicable, a reminder letter will be sent to the patient
regarding the next appointment.

BI-RADS CATEGORY  2: Benign.

## 2019-03-28 IMAGING — MG MM DIGITAL DIAGNOSTIC BILAT W/ TOMO W/ CAD
6 series · 6 of 22 positions shown · non-contrast
Comparison: Previous exam(s).

CLINICAL DATA: 57-year-old female status post excisional biopsy of
a complex sclerosing reason in September 2017. Patient presents today
with bilateral tenderness, sensitivity and lumpiness in the
subareolar breast.

EXAM:
DIGITAL DIAGNOSTIC BILATERAL MAMMOGRAM WITH CAD AND TOMO
ULTRASOUND BILATERAL BREAST

[R MLO synth-2D]
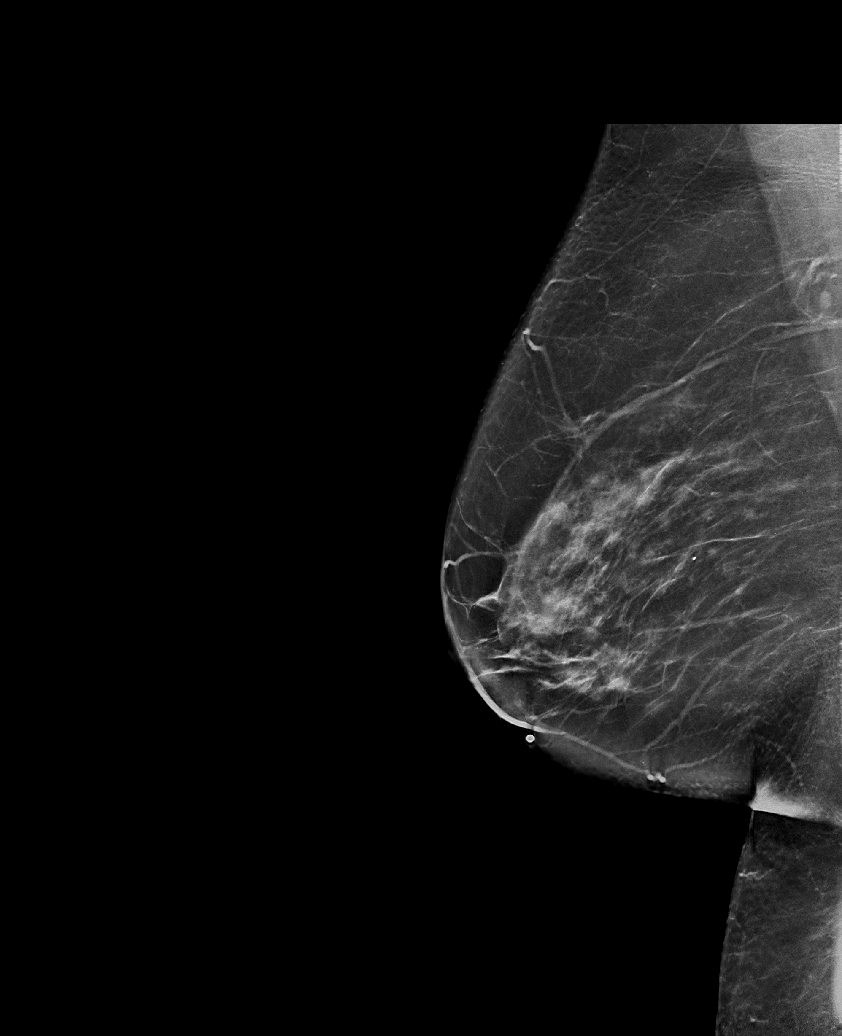

[R CC synth-2D]
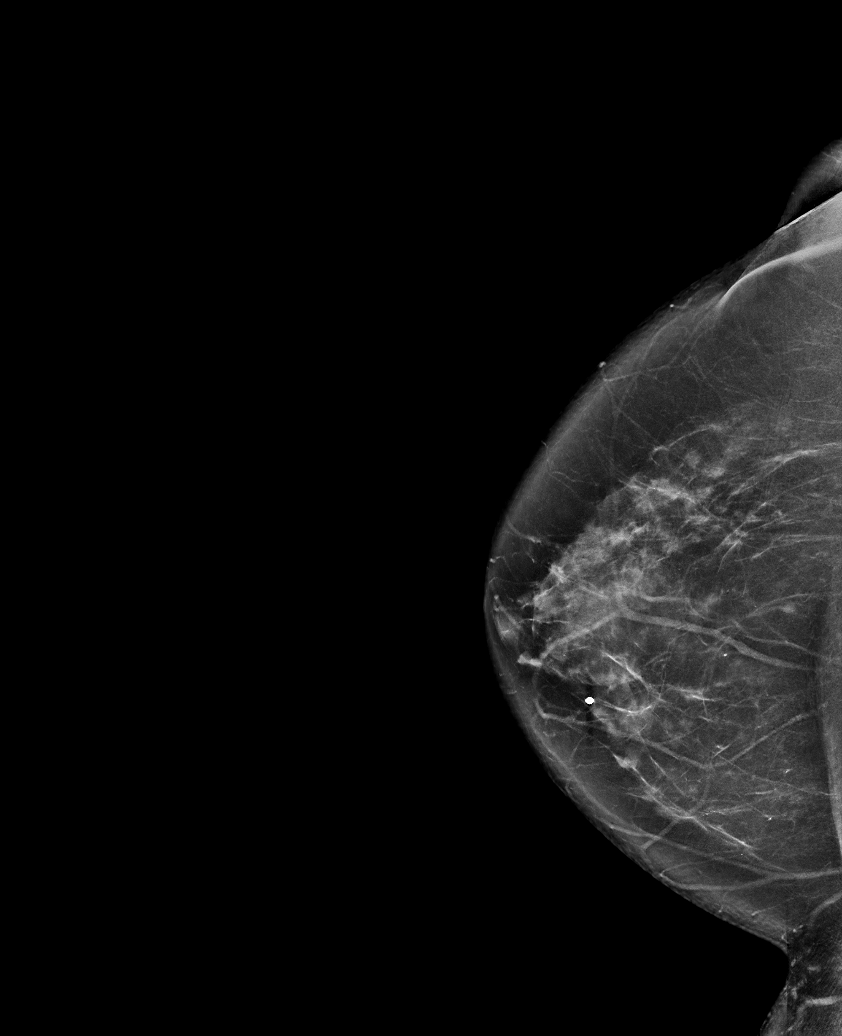

[R CC tomo · tomo slice 41/80.0]
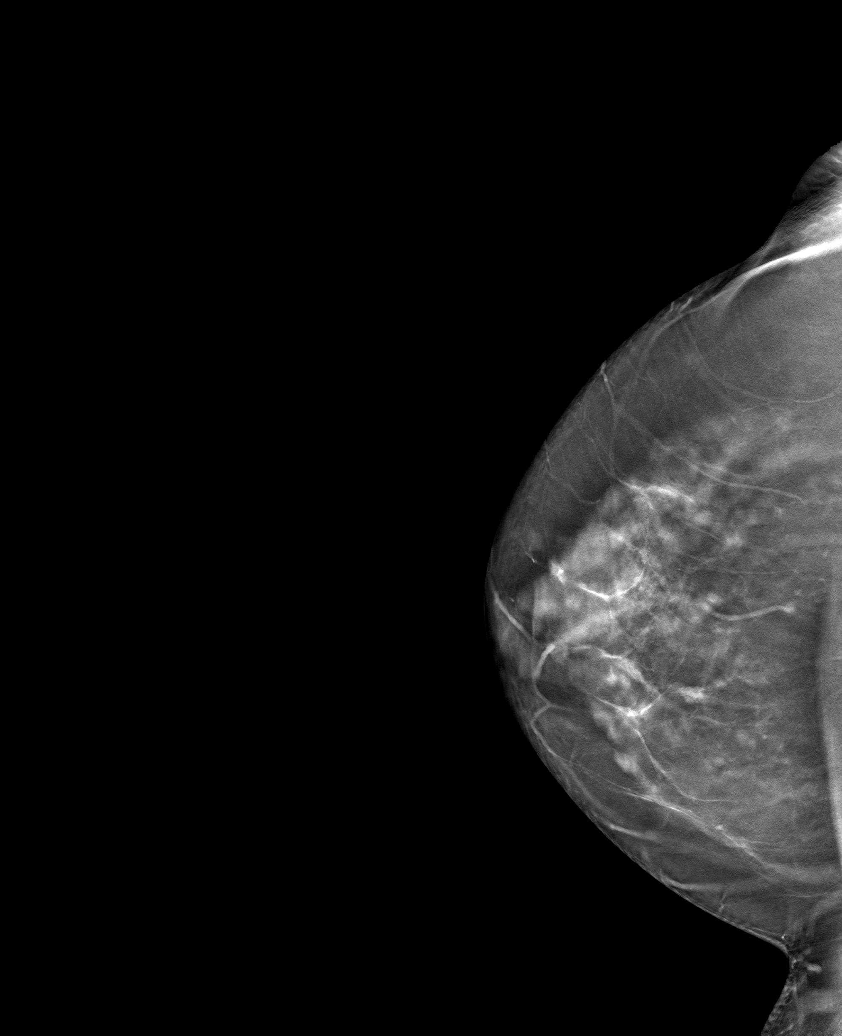

[L CC tomo · tomo slice 35/70.0]
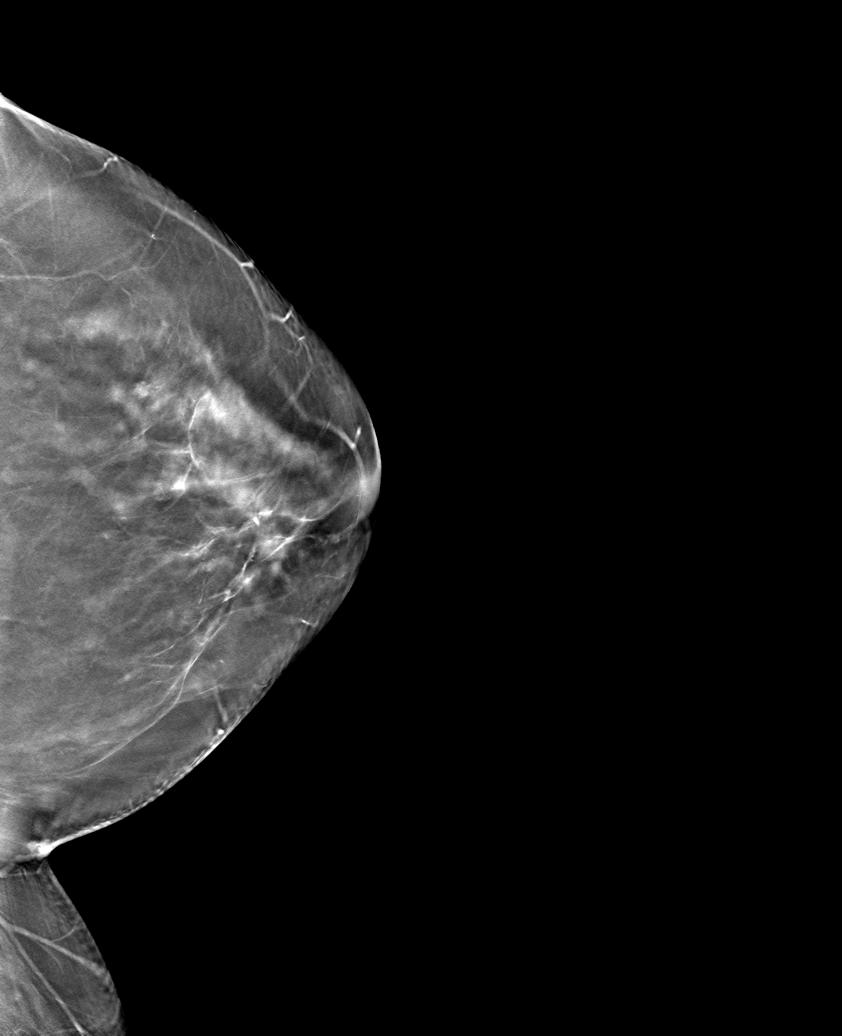

[R MLO tomo · tomo slice 27/52.0]
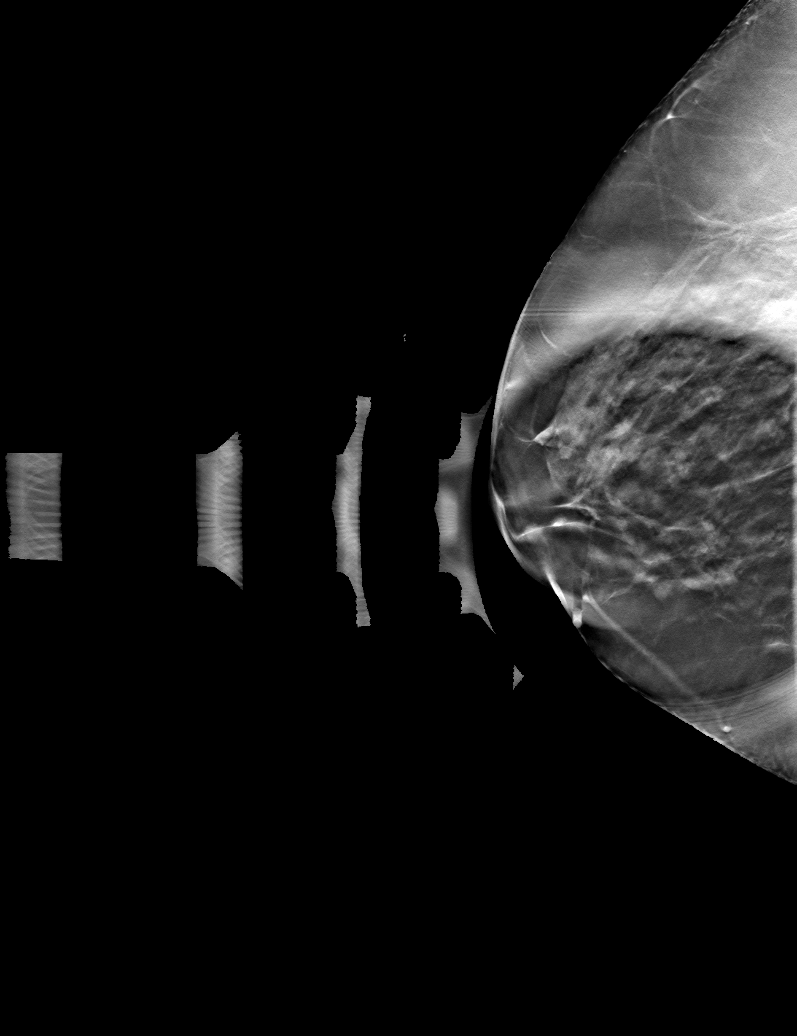

[L MLO tomo · tomo slice 43/85.0]
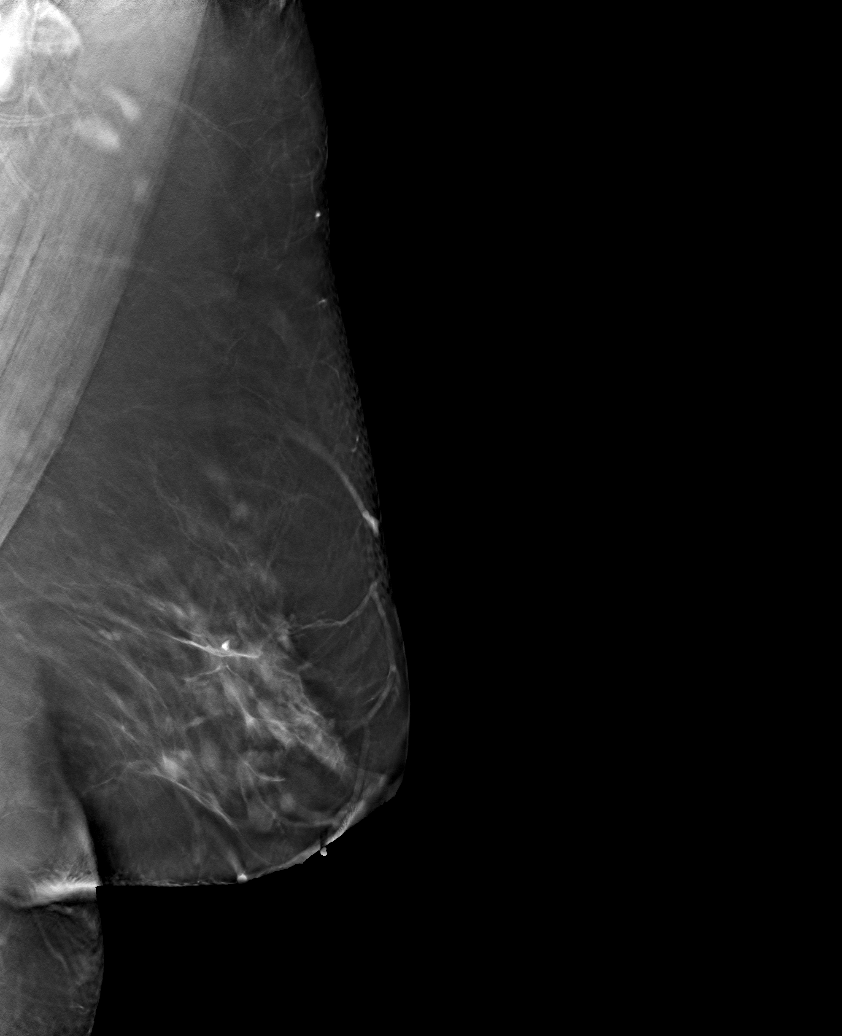

[6 of 22 positions shown; findings below may reference images not displayed]

ACR Breast Density Category c: The breast tissue is heterogeneously
dense, which may obscure small masses.
FINDINGS: Radiopaque BBs were placed at the site of the patient's clinical
symptoms in the periareolar breast bilaterally. An oval,
circumscribed equal density mass is seen deep to the left-sided BB.
This is mammographically stable dating back to at least 1893.
Additional scattered circumscribed equal density masses are noted
bilaterally. No suspicious findings are identified mammographically.

Mammographic images were processed with CAD.

On physical exam, I palpated general lumpiness of the bilateral
breast. No focal suspicious masses are palpated.

Targeted ultrasound is performed, showing a circumscribed,
multi-cystic mass with posterior acoustic enhancement at the 7
position 1 cm from nipple. It measures 7 x 7 x 6 mm. There is no
associated vascularity. This corresponds with the mammographically
stable mass and is consistent with a cluster of cysts. No additional
suspicious findings are identified in either breast.
IMPRESSION: 1. No suspicious mammographic or sonographic findings corresponding
with the patient's bilateral symptoms. Recommendation is for
clinical follow-up.
2. Benign left breast cyst cluster demonstrating mammographic
stability dating back to 1893. No further imaging follow-up
required.

RECOMMENDATION:
1. Clinical follow-up recommended for the symptomatic area of
concern in the bilateral breasts. Any further workup should be based
on clinical grounds.
2.  Screening mammogram in one year.(Code:HW-7-VFQ)

I have discussed the findings and recommendations with the patient.
Results were also provided in writing at the conclusion of the
visit. If applicable, a reminder letter will be sent to the patient
regarding the next appointment.

BI-RADS CATEGORY  2: Benign.

## 2019-03-28 IMAGING — US US BREAST*R* LIMITED INC AXILLA
1 series · 1 of 1 positions shown · non-contrast
Comparison: Previous exam(s).

CLINICAL DATA: 57-year-old female status post excisional biopsy of
a complex sclerosing reason in September 2017. Patient presents today
with bilateral tenderness, sensitivity and lumpiness in the
subareolar breast.

EXAM:
DIGITAL DIAGNOSTIC BILATERAL MAMMOGRAM WITH CAD AND TOMO
ULTRASOUND BILATERAL BREAST

[Series 1: us breast*right* limited inc axilla · 0.07mm/px · 1 of 1 slices shown]
[im 1/1]
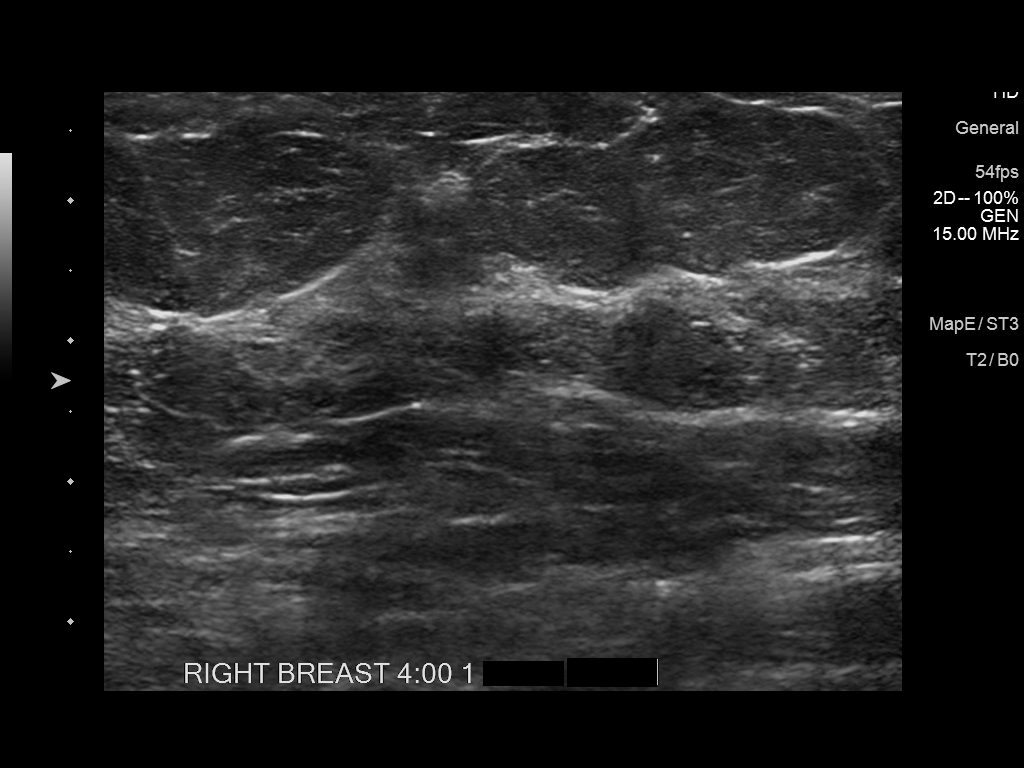

[1 of 1 positions shown; findings below may reference images not displayed]

ACR Breast Density Category c: The breast tissue is heterogeneously
dense, which may obscure small masses.
FINDINGS: Radiopaque BBs were placed at the site of the patient's clinical
symptoms in the periareolar breast bilaterally. An oval,
circumscribed equal density mass is seen deep to the left-sided BB.
This is mammographically stable dating back to at least 1893.
Additional scattered circumscribed equal density masses are noted
bilaterally. No suspicious findings are identified mammographically.

Mammographic images were processed with CAD.

On physical exam, I palpated general lumpiness of the bilateral
breast. No focal suspicious masses are palpated.

Targeted ultrasound is performed, showing a circumscribed,
multi-cystic mass with posterior acoustic enhancement at the 7
position 1 cm from nipple. It measures 7 x 7 x 6 mm. There is no
associated vascularity. This corresponds with the mammographically
stable mass and is consistent with a cluster of cysts. No additional
suspicious findings are identified in either breast.
IMPRESSION: 1. No suspicious mammographic or sonographic findings corresponding
with the patient's bilateral symptoms. Recommendation is for
clinical follow-up.
2. Benign left breast cyst cluster demonstrating mammographic
stability dating back to 1893. No further imaging follow-up
required.

RECOMMENDATION:
1. Clinical follow-up recommended for the symptomatic area of
concern in the bilateral breasts. Any further workup should be based
on clinical grounds.
2.  Screening mammogram in one year.(Code:[id number])

I have discussed the findings and recommendations with the patient.
Results were also provided in writing at the conclusion of the
visit. If applicable, a reminder letter will be sent to the patient
regarding the next appointment.

BI-RADS CATEGORY  2: Benign.

## 2020-08-06 ENCOUNTER — Ambulatory Visit: Payer: Self-pay | Attending: Oncology

## 2020-08-06 ENCOUNTER — Other Ambulatory Visit: Payer: Self-pay

## 2020-08-06 ENCOUNTER — Ambulatory Visit
Admission: RE | Admit: 2020-08-06 | Discharge: 2020-08-06 | Disposition: A | Discharge status: Home / Self Care | Payer: Self-pay | Source: Ambulatory Visit | Attending: Oncology | Admitting: Oncology

## 2020-08-06 DIAGNOSIS — Z0000 Encounter for general adult medical examination without abnormal findings: Secondary | ICD-10-CM

## 2020-08-06 NOTE — Progress Notes (Signed)
Patient pre-screened for [facility] eligibility . Two patient identifiers used for verification that I was speaking to correct patient.  Patient to Present directly to [facility] today for [facility] screening mammogram.

## 2020-08-12 NOTE — Progress Notes (Signed)
Letter mailed from [facility] to notify of normal mammogram results.  Patient to return in one year for annual screening.  Copy to [facility]. 

## 2023-08-25 ENCOUNTER — Telehealth: Payer: Self-pay | Admitting: *Deleted

## 2023-08-30 ENCOUNTER — Other Ambulatory Visit: Payer: Self-pay | Admitting: *Deleted

## 2023-08-30 ENCOUNTER — Inpatient Hospital Stay
Admission: RE | Admit: 2023-08-30 | Discharge: 2023-08-30 | Disposition: A | Discharge status: Home / Self Care | Payer: Self-pay | Source: Ambulatory Visit | Attending: Obstetrics and Gynecology | Admitting: Obstetrics and Gynecology

## 2023-08-30 ENCOUNTER — Other Ambulatory Visit: Payer: Self-pay | Admitting: Physician Assistant

## 2023-08-30 DIAGNOSIS — Z1231 Encounter for screening mammogram for malignant neoplasm of breast: Secondary | ICD-10-CM

## 2023-09-28 ENCOUNTER — Ambulatory Visit
Admission: RE | Admit: 2023-09-28 | Discharge: 2023-09-28 | Disposition: A | Discharge status: Home / Self Care | Payer: Self-pay | Source: Ambulatory Visit | Attending: Physician Assistant | Admitting: Physician Assistant

## 2023-09-28 DIAGNOSIS — Z1231 Encounter for screening mammogram for malignant neoplasm of breast: Secondary | ICD-10-CM | POA: Insufficient documentation

## 2024-08-30 ENCOUNTER — Other Ambulatory Visit: Payer: Self-pay | Admitting: Obstetrics and Gynecology
